# Patient Record
Sex: Male | Born: 1960 | Race: Black or African American | Hispanic: No | Smoking: Current every day smoker
Health system: Southern US, Community
[De-identification: ages and names within clinical notes are randomized; demographics above are authoritative.]

## PROBLEM LIST (undated history)

## (undated) DIAGNOSIS — E785 Hyperlipidemia, unspecified: Secondary | ICD-10-CM

## (undated) DIAGNOSIS — I509 Heart failure, unspecified: Secondary | ICD-10-CM

## (undated) DIAGNOSIS — I10 Essential (primary) hypertension: Secondary | ICD-10-CM

## (undated) DIAGNOSIS — N189 Chronic kidney disease, unspecified: Secondary | ICD-10-CM

## (undated) DIAGNOSIS — F329 Major depressive disorder, single episode, unspecified: Secondary | ICD-10-CM

## (undated) DIAGNOSIS — M1990 Unspecified osteoarthritis, unspecified site: Secondary | ICD-10-CM

## (undated) DIAGNOSIS — F32A Depression, unspecified: Secondary | ICD-10-CM

## (undated) DIAGNOSIS — N400 Enlarged prostate without lower urinary tract symptoms: Secondary | ICD-10-CM

## (undated) HISTORY — PX: TONSILLECTOMY AND ADENOIDECTOMY: SHX28

---

## 2004-05-31 ENCOUNTER — Ambulatory Visit: Payer: Self-pay

## 2004-07-13 ENCOUNTER — Emergency Department: Payer: Self-pay | Admitting: Emergency Medicine

## 2006-07-27 ENCOUNTER — Ambulatory Visit: Payer: Self-pay | Admitting: Gastroenterology

## 2007-01-10 ENCOUNTER — Ambulatory Visit: Payer: Self-pay | Admitting: Family Medicine

## 2007-08-05 ENCOUNTER — Emergency Department: Payer: Self-pay | Admitting: Emergency Medicine

## 2008-06-27 IMAGING — US ABDOMEN ULTRASOUND
1 series · 17 of 25 positions shown · non-contrast
Comparison: none

REASON FOR EXAM: Abd Pain Call Report [id number]
COMMENTS:

[Series 1: abdomen ultrasound · 17 of 66 slices shown]
[im 1/66]
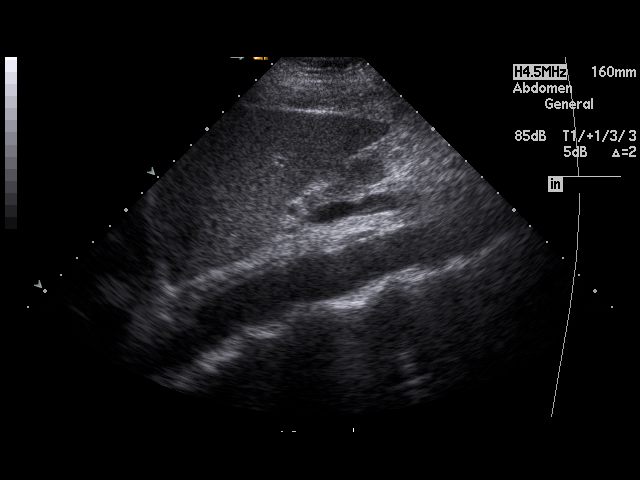
[im 6/66]
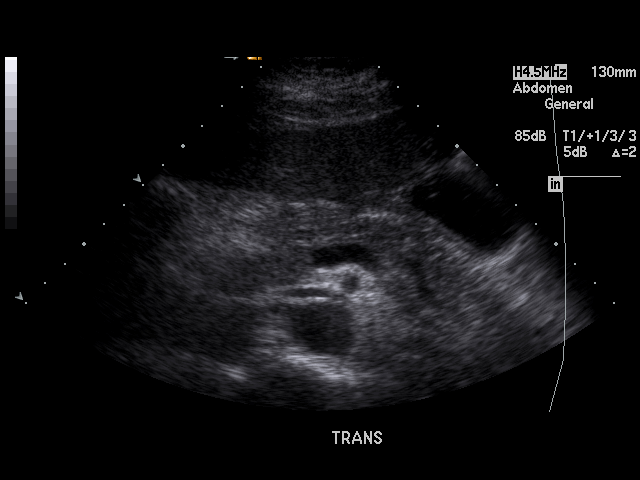
[im 9/66]
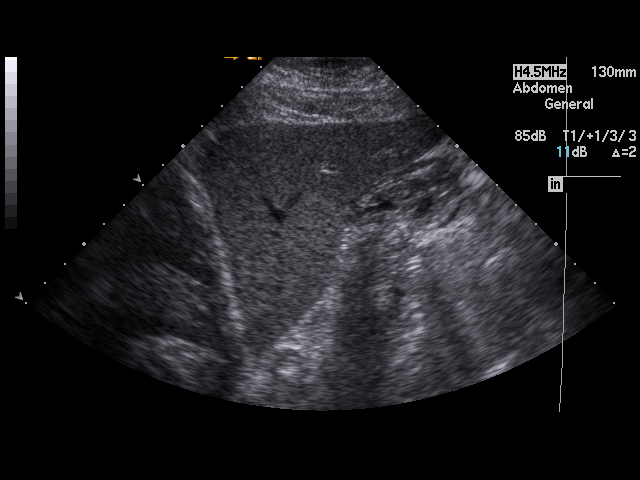
[im 14/66]
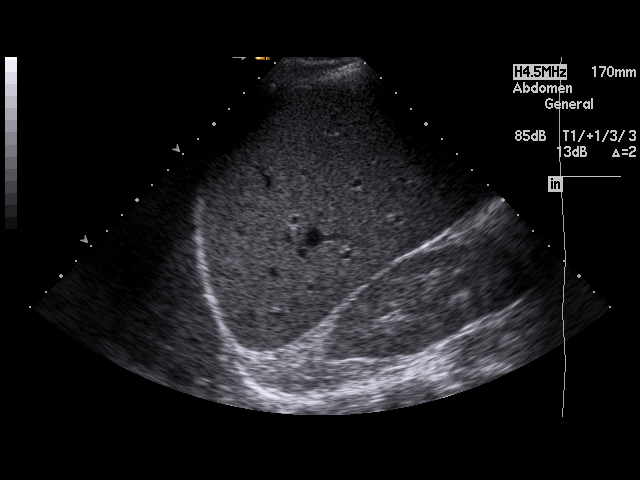
[im 17/66]
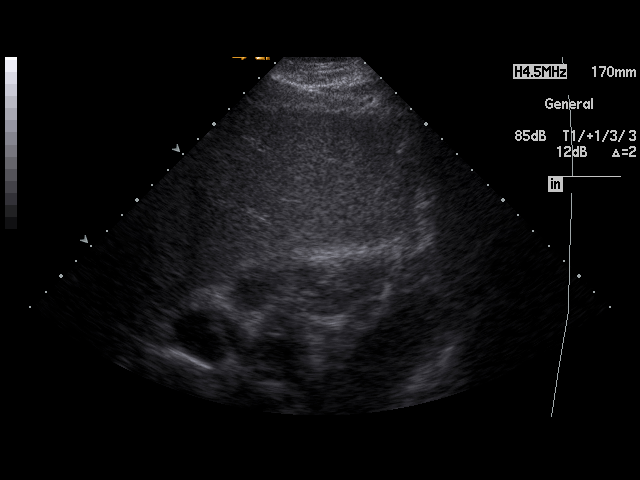
[im 22/66]
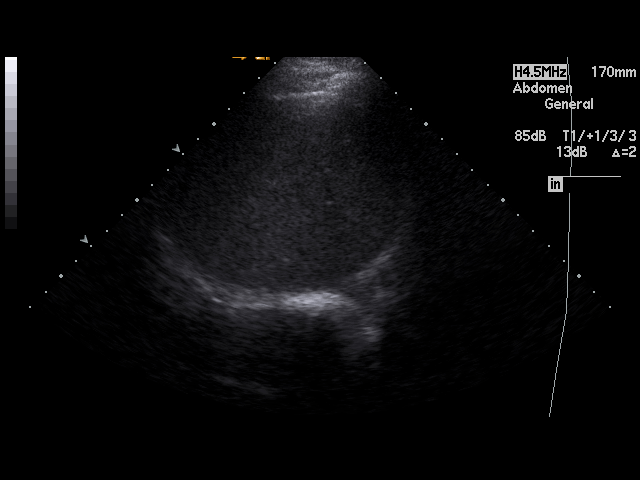
[im 25/66]
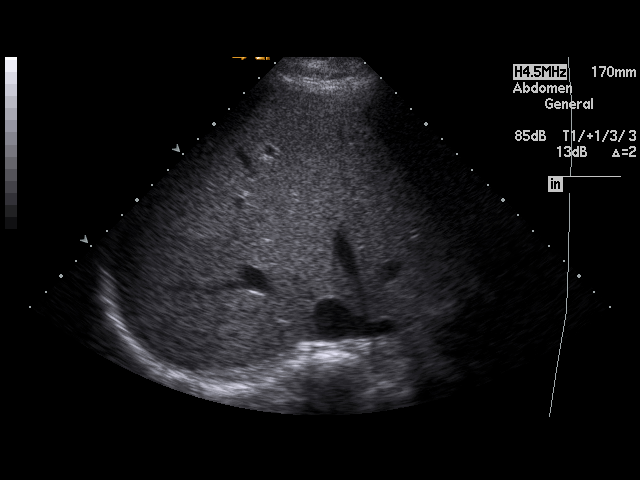
[im 30/66]
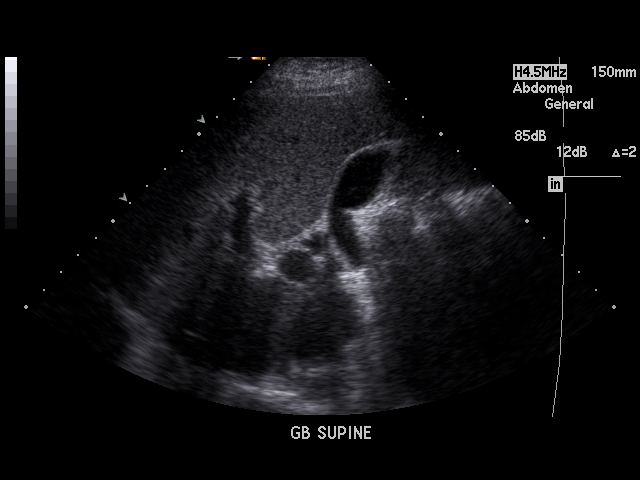
[im 33/66]
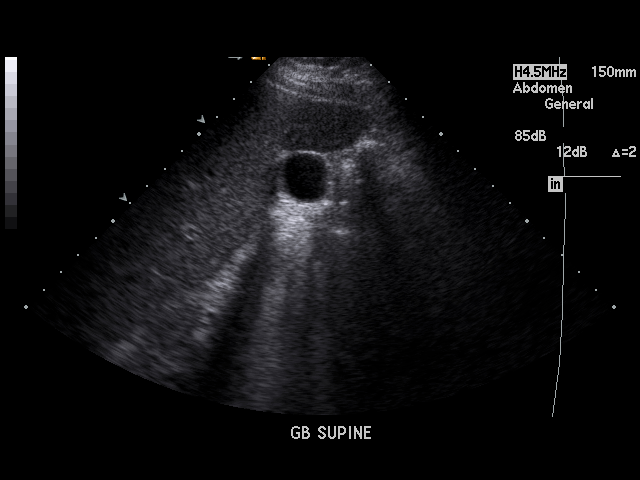
[im 36/66]
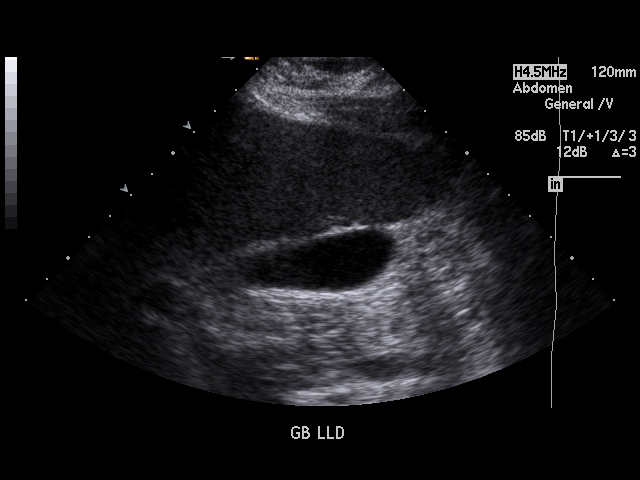
[im 41/66]
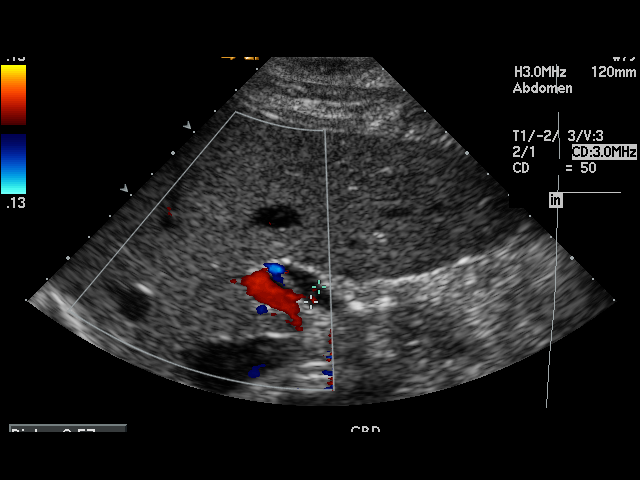
[im 44/66]
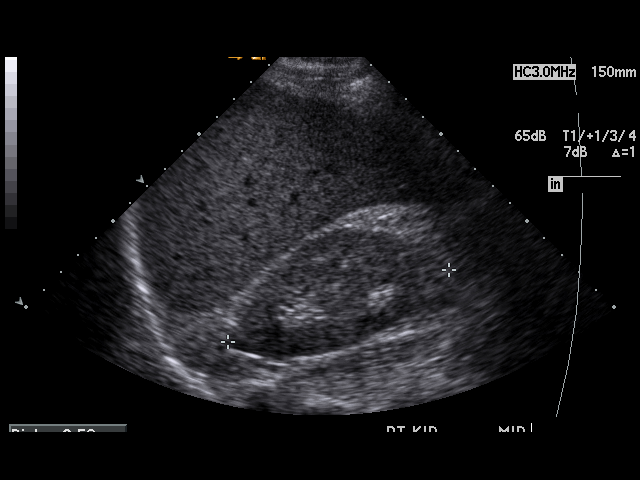
[im 49/66]
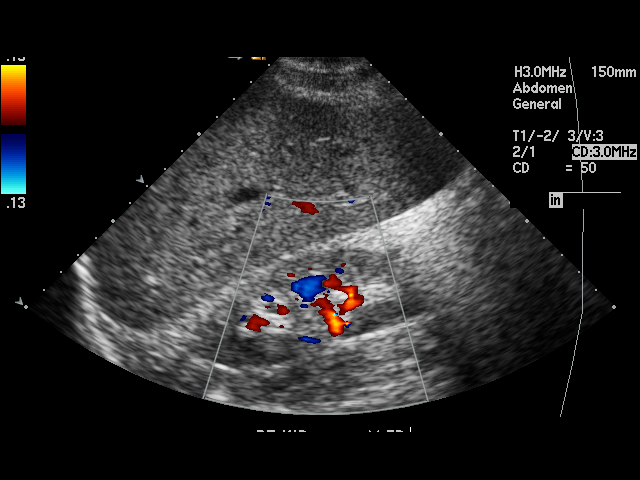
[im 52/66]
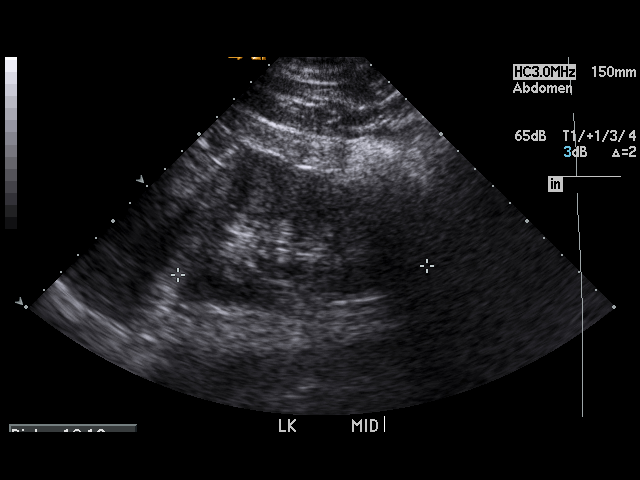
[im 57/66]
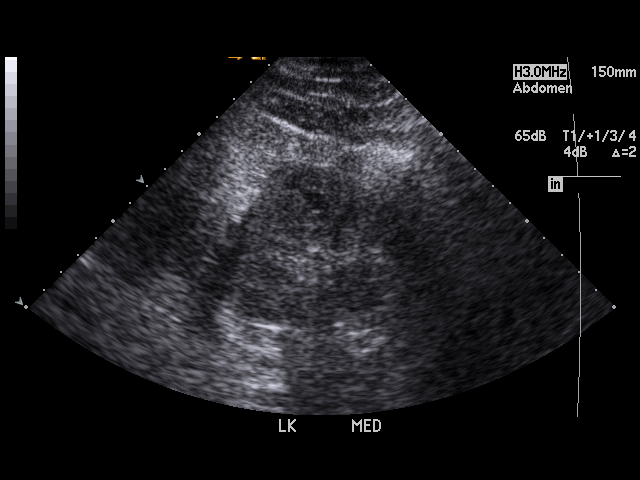
[im 60/66]
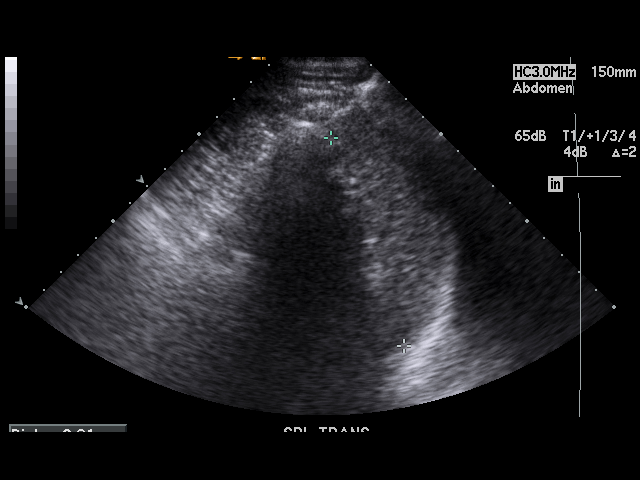
[im 66/66]
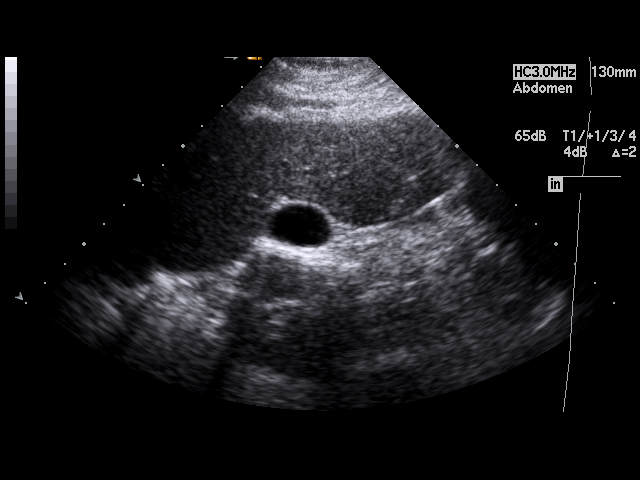

[17 of 25 positions shown; findings below may reference images not displayed]

PROCEDURE:     US  - US ABDOMEN GENERAL SURVEY  - January 10, 2007  [DATE]

RESULT:     Sonographic evaluation of the abdomen demonstrates the liver,
spleen, pancreas, gallbladder, aorta and kidneys appear normal. The common
bile duct diameter is abnormal measuring up to 6.7 mm. Correlation with LFTs
is recommended.
IMPRESSION: Unremarkable study aside from prominence of the common bile duct. Further
laboratory correlation is recommended.

## 2009-06-15 ENCOUNTER — Other Ambulatory Visit: Payer: Self-pay | Admitting: Family

## 2009-11-10 ENCOUNTER — Emergency Department: Payer: Self-pay | Admitting: Emergency Medicine

## 2011-02-28 ENCOUNTER — Other Ambulatory Visit: Payer: Self-pay | Admitting: Internal Medicine

## 2011-02-28 MED ORDER — CLONIDINE HCL 0.3 MG PO TABS: 0.3000 mg | ORAL_TABLET | Freq: Three times a day (TID) | ORAL | Status: DC

## 2011-06-02 ENCOUNTER — Other Ambulatory Visit: Payer: Self-pay | Admitting: *Deleted

## 2011-06-02 NOTE — Telephone Encounter (Signed)
Sorry this was done in error.

## 2011-06-02 NOTE — Telephone Encounter (Signed)
This is the wrong chart.  The Whitney Muse who is my patient has been seen here before and doesn't take clonidine three times daily

## 2011-06-06 ENCOUNTER — Other Ambulatory Visit: Payer: Self-pay | Admitting: Internal Medicine

## 2011-06-06 NOTE — Telephone Encounter (Signed)
This is not my patient.2nd request from pharmacy,  Please calll them and tell them they have the wrong patient/doctor.

## 2011-06-06 NOTE — Telephone Encounter (Signed)
Office Message [address] [place], [place] [address] p. [phone] f. [phone] To: [facility] (Daytime Triage) Fax: [phone] From: [facility] Date/ Time: 06/06/2011 5:05 PM Taken By: Crissie Figures, CSR Caller: Reita Cliche Facility: not collected Patient: Henry Gallagher, Henry Gallagher DOB: 1960/12/09 Phone: [phone] Reason for Call: Clonodine has not been called in for a refill pharmacy is [facility] on [address]. He has been out since last Thursday. Regarding Appointment: Appt Date: Appt Time: Unknown Provider: Reason: Details: Outcome:

## 2011-06-07 ENCOUNTER — Telehealth: Payer: Self-pay | Admitting: Internal Medicine

## 2011-06-07 MED ORDER — CLONIDINE HCL 0.3 MG PO TABS: 0.3000 mg | ORAL_TABLET | Freq: Two times a day (BID) | ORAL | Status: DC

## 2011-06-07 NOTE — Telephone Encounter (Signed)
[facility] Triage Call Report Triage Record Num: [id number] Operator: Baldomero Lamy Patient Name: Henry Gallagher Call Date & Time: 06/06/2011 5:09:15PM Patient Phone: [phone] PCP: Daphine Deutscher) Patient Gender: Male PCP Fax : [phone] Patient DOB: May 20, 1960 Practice Name: [facility] Day Reason for Call: Caller: [name]/Patient is calling with a question about Meds Not At Pharmacy.The medication was written by Duncan Dull. Pt calling regarding no RX for Catapress at pharmacy. Pt states he called in this am and no med ready-pt has none for tomorrow. Check in EPIC-note but not called in yet. Pt frustrated. OK # 3 of 0.3 mg (3 for tomorrow, 2/26) per standing order, until med called in. Called this in to [place], Pharm tech at [facility] [phone]. Protocol(s) Used: Medication Questions - Adult Recommended Outcome per Protocol: Call Dispensing Pharmacy or Provider Immediately Reason for Outcome: Unable to obtain prescribed medication related to available resources AND situation poses immediate clinical risk Care Advice: ~ Have pharmacy phone number and prescription information available when you speak with provider. ~ Ask provider if there is a generic alternative or a similar drug covered by your insurance. ~ Ask provider if they have samples. Ask provider or pharmacist about patient assistance programs run by pharmaceutical companies to provide free medications based on documented financial need. Consider the Partnership for Prescription Assistance; information available toll free [phone]. ~ 06/06/2011 5:30:21PM Page 1 of 1 [email or web address]

## 2011-06-07 NOTE — Telephone Encounter (Signed)
Notified patient of message.  He stated he is your patient but he hasn't been seen in over a year.  I asked him to make an appt but he stated he can't because he doesn't have insurance and can't afford the copay.  I told him I would let you know and he hung up the phone without saying anything else.

## 2011-06-07 NOTE — Telephone Encounter (Signed)
I have talked to patient this morning.  Dr. Darrick Huntsman will not refill this medication because she said he is not a patient of hers.  He told me he is but hasn't been seen in over a year, and will not make an appt because he stated he does not have insurance and can't afford the copay.  I have let Dr. Darrick Huntsman know of this and I am waiting for a response from her.

## 2011-06-07 NOTE — Telephone Encounter (Signed)
I am sorry that he has no insurance but I cannot fill a prescription on a patient I have no records on and haven't seen in a years. And I NEVER prescribe clonidine three times daily  But since he is without it he is probably having uncontrolled hypertension you can call him in #60,  Have him take it two times daily, no refills,  Please tell him toeither make an appt here in the next 30 days or  go to the [facility] or the [facility] clinic. If he makes an appt here he needs to get his records sent over from [facility].

## 2011-06-08 MED ORDER — CLONIDINE HCL 0.3 MG PO TABS: 0.3000 mg | ORAL_TABLET | Freq: Two times a day (BID) | ORAL | Status: DC

## 2011-06-08 NOTE — Telephone Encounter (Signed)
Notified patient of message.  I have called in 60 tablets.  He stated he will call back next week to make an appt with you because he is out of town right now.

## 2012-08-24 ENCOUNTER — Emergency Department: Payer: Self-pay | Admitting: Emergency Medicine

## 2012-09-02 ENCOUNTER — Emergency Department: Payer: Self-pay | Admitting: Emergency Medicine

## 2012-10-08 ENCOUNTER — Inpatient Hospital Stay: Payer: Self-pay | Admitting: Internal Medicine

## 2012-10-08 LAB — CK TOTAL AND CKMB (NOT AT ARMC)
CK, Total: 109 U/L (ref 35–232)
CK, Total: 73 U/L (ref 35–232)
CK-MB: 2.7 ng/mL (ref 0.5–3.6)
CK-MB: 1.9 ng/mL (ref 0.5–3.6)
CK-MB: 1.8 ng/mL (ref 0.5–3.6)

## 2012-10-08 LAB — COMPREHENSIVE METABOLIC PANEL
Albumin: 3.4 g/dL (ref 3.4–5.0)
Anion Gap: 7 (ref 7–16)
BUN: 21 mg/dL — ABNORMAL HIGH (ref 7–18)
Calcium, Total: 8.8 mg/dL (ref 8.5–10.1)
Chloride: 109 mmol/L — ABNORMAL HIGH (ref 98–107)
Creatinine: 1.92 mg/dL — ABNORMAL HIGH (ref 0.60–1.30)
Osmolality: 284 (ref 275–301)
Potassium: 4 mmol/L (ref 3.5–5.1)
SGOT(AST): 41 U/L — ABNORMAL HIGH (ref 15–37)
SGPT (ALT): 37 U/L (ref 12–78)
Sodium: 140 mmol/L (ref 136–145)
Total Protein: 7.3 g/dL (ref 6.4–8.2)

## 2012-10-08 LAB — CBC
HCT: 53.1 % — ABNORMAL HIGH (ref 40.0–52.0)
HGB: 17.6 g/dL (ref 13.0–18.0)
MCH: 32.8 pg (ref 26.0–34.0)
MCV: 99 fL (ref 80–100)

## 2012-10-08 LAB — TROPONIN I
Troponin-I: 0.43 ng/mL — ABNORMAL HIGH
Troponin-I: 0.45 ng/mL — ABNORMAL HIGH

## 2012-10-08 LAB — PRO B NATRIURETIC PEPTIDE: B-Type Natriuretic Peptide: 4993 pg/mL — ABNORMAL HIGH (ref 0–125)

## 2012-10-08 LAB — PROTIME-INR: INR: 1

## 2012-10-09 LAB — BASIC METABOLIC PANEL
BUN: 23 mg/dL — ABNORMAL HIGH (ref 7–18)
Chloride: 106 mmol/L (ref 98–107)
EGFR (Non-African Amer.): 37 — ABNORMAL LOW
Osmolality: 280 (ref 275–301)
Potassium: 3.8 mmol/L (ref 3.5–5.1)
Sodium: 139 mmol/L (ref 136–145)

## 2012-10-09 LAB — CBC WITH DIFFERENTIAL/PLATELET
Basophil #: 0 10*3/uL (ref 0.0–0.1)
Eosinophil #: 0.2 10*3/uL (ref 0.0–0.7)
Lymphocyte #: 3.1 10*3/uL (ref 1.0–3.6)
Lymphocyte %: 50.1 %
MCH: 33.2 pg (ref 26.0–34.0)
MCHC: 33.9 g/dL (ref 32.0–36.0)
Monocyte %: 7.7 %
Neutrophil %: 38 %
RBC: 5.13 10*6/uL (ref 4.40–5.90)
WBC: 6.2 10*3/uL (ref 3.8–10.6)

## 2012-10-10 LAB — BASIC METABOLIC PANEL
BUN: 27 mg/dL — ABNORMAL HIGH (ref 7–18)
Chloride: 107 mmol/L (ref 98–107)
Co2: 26 mmol/L (ref 21–32)
Creatinine: 2.14 mg/dL — ABNORMAL HIGH (ref 0.60–1.30)
Potassium: 3.6 mmol/L (ref 3.5–5.1)

## 2013-02-27 ENCOUNTER — Other Ambulatory Visit: Payer: Self-pay

## 2013-02-27 LAB — COMPREHENSIVE METABOLIC PANEL
Anion Gap: 3 — ABNORMAL LOW (ref 7–16)
BUN: 24 mg/dL — ABNORMAL HIGH (ref 7–18)
EGFR (African American): 45 — ABNORMAL LOW
EGFR (Non-African Amer.): 39 — ABNORMAL LOW
Potassium: 3.9 mmol/L (ref 3.5–5.1)
Total Protein: 7.5 g/dL (ref 6.4–8.2)

## 2014-01-14 ENCOUNTER — Ambulatory Visit: Payer: Self-pay | Admitting: Internal Medicine

## 2014-05-25 ENCOUNTER — Observation Stay: Payer: Self-pay | Admitting: Internal Medicine

## 2014-08-01 NOTE — Discharge Summary (Signed)
PATIENT NAME:  Henry Gallagher, Henry Gallagher MR#:  [id number] DATE OF BIRTH:  [birth year]  DATE OF ADMISSION:  10/08/2012 DATE OF DISCHARGE:  10/10/2012  PRIMARY CARE PHYSICIAN:  Hopefully will be at the [facility].   FINAL DIAGNOSES: 1.  Acute systolic congestive heart failure with cardiomyopathy and moderate aortic regurgitation.  2.  Malignant hypertension.  3.  Chronic kidney disease, stage 3.  4.  Elevated troponin.   MEDICATIONS ON DISCHARGE:  Include lisinopril 10 mg twice a day, aspirin 81 mg daily, Coreg 3.125 mg twice a day, Lasix 20 mg 3 tabs once a day, hydralazine 25 mg every 8 hours, potassium chloride 10 mEq extended-release daily.   DIET:  Low sodium diet, regular consistency.   ACTIVITY:  As tolerated.   FOLLOW-UP:  With Dr. Ubaldo Glassing, cardiology, in one week.  Dr. Juleen China, nephrology, in four weeks.  [facility] in 2 to 4 weeks.    The patient was admitted 09/28/2012 and discharged 10/10/2012, came in with shortness of breath and was admitted with congestive heart failure, malignant hypertension and chronic kidney disease.  Laboratory and radiological data during the hospital course included an EKG that showed sinus rhythm, premature ventricular complexes.  Troponin borderline at 0.49, INR of 1.0.  White blood cell count 5.6, H and H 17.6 and 53.1, platelet count of 184, glucose 128, BUN 21, creatinine 1.92, sodium 140, potassium 4.0, chloride 109, CO2 24, calcium 8.8.  Liver function tests normal range.  Chest x-ray showed findings secondary to interstitial pulmonary edema, small bilateral pulmonary effusions.  Troponin borderline at 0.43.  Troponin borderline at 0.45.  Echocardiogram showed an EF of 25% to 30%, moderately to severely global left ventricular systolic function, moderate to severely increased left ventricular internal cavity size, mild mitral valve regurgitation, moderate aortic regurgitation.  Creatinine upon discharge 2.14 with a GFR of 40.   HOSPITAL COURSE PER  PROBLEM LIST:  1.  For the patient's acute systolic congestive heart failure with cardiomyopathy and moderate aortic regurgitation, the patient was diuresed with IV Lasix, started on Coreg and then switched to metoprolol and back to Coreg since he had sweating with the metoprolol.  He was also started on lisinopril and hydralazine.  He was also given a nitro patch.  Blood pressure upon discharge came down to 140/88.  The patient's lungs were clear upon discharge.  He was breathing comfortably on room air, stable for discharge home.  2.  For his malignant hypertension, he ran out of his clonidine for a few days prior to coming into the hospital.  Blood pressure on presentation was 227/133.  Blood pressure upon discharge 140/88.  The patient was discharged home on Coreg, lisinopril, Lasix and hydralazine.  Continued monitoring as outpatient as needed.  3.  Chronic kidney disease, stage 3.  Creatinine did not move with the IV Lasix or starting of the lisinopril.  I do recommend checking a BMP in follow-up appointment.  I believe this is all secondary to chronic kidney disease and uncontrolled blood pressure longstanding and also poor perfusion with cardiomyopathy.  I did give the patient the card for the nephrologist to follow up as outpatient.  Hopefully can avoid dialysis in the future.  4.  Elevated troponin, most likely secondary to systolic congestive heart failure, malignant hypertension and chronic kidney disease.  This is not a myocardial infarction.  The patient is on Coreg and aspirin.   Time spent on discharge 35 minutes.    ____________________________ Tana Conch. Leslye Peer, MD  rjw:ea D: 10/10/2012 16:28:15 ET T: 10/10/2012 23:22:17 ET JOB#: [id number]  cc: Tana Conch. Leslye Peer, MD, <Dictator> [place] A. Ubaldo Glassing, MD Mamie Levers, MD   Marisue Brooklyn MD ELECTRONICALLY SIGNED 10/20/2012 11:07

## 2014-08-01 NOTE — Consult Note (Signed)
   Present Illness 54 yo male with history of hyperension who was admitted after being off of all of his medications including clonidine for 3 weeks due to difficulty in buying his medications. He was noted to be profoundly hyertensive complicated by pulmonary edema. He has a mild serum troponin elevation to 0.43. He denies chet pain and feels somewhat better since resuming his meds.   Physical Exam:  GEN no acute distress, thin   HEENT PERRL, hearing intact to voice   NECK supple  No masses   RESP normal resp effort  no use of accessory muscles   CARD Regular rate and rhythm  Normal, S1, S2  No murmur   ABD denies tenderness  normal BS  no Adominal Mass   LYMPH negative neck, negative axillae   EXTR negative edema   SKIN normal to palpation   NEURO cranial nerves intact, motor/sensory function intact   PSYCH A+O to time, place, person   Review of Systems:  Subjective/Chief Complaint shortness of breath   General: Fatigue   Skin: No Complaints   ENT: No Complaints   Eyes: No Complaints   Neck: No Complaints   Respiratory: Short of breath   Cardiovascular: Dyspnea   Gastrointestinal: No Complaints   Genitourinary: No Complaints   Vascular: No Complaints   Musculoskeletal: No Complaints   Neurologic: No Complaints   Hematologic: No Complaints   Endocrine: No Complaints   Psychiatric: No Complaints   Review of Systems: All other systems were reviewed and found to be negative   Medications/Allergies Reviewed Medications/Allergies reviewed   EKG:  EKG NSR   Abnormal NSSTTW changes   Interpretation sr with multiple pvcs    No Known Allergies:    Impression Pt with history of hypertension which is accelerated due to noncompliance with meds. Has been off of his meds for the past 3 weeks. He has evidence of pulmonary edema on cxr. Mil troponin elevation which does not appear to be seondary to an acute coronary event. APpears to be demand ischemia. CHF  appears to be acute on chronic diastollic chf. Accelerated hypertension appears to be etiology. Compiane with meds was discussed.   Plan 1. Resume home meds 2. Low sodium diet. 3. Social services eval regarding financial difficulties with eds 4. Ambulate and follow for symptoms.   Electronic Signatures: Teodoro Spray (MD)  (Signed 30-Jun-14 21:29)  Authored: General Aspect/Present Illness, History and Physical Exam, Review of System, EKG , Allergies, Impression/Plan   Last Updated: 30-Jun-14 21:29 by Teodoro Spray (MD)

## 2014-08-01 NOTE — H&P (Signed)
PATIENT NAME:  Henry Gallagher, Henry Gallagher MR#:  [id number] DATE OF BIRTH:  16-Apr-1960  DATE OF ADMISSION:  10/08/2012  PRIMARY CARE PHYSICIAN: None.  REFERRING PHYSICIAN:  Lavonia Drafts, MD  CHIEF COMPLAINT: Shortness of breath.   HISTORY OF PRESENT ILLNESS: Henry Gallagher is a 54 year old African American male with known history of hypertension who has been experiencing shortness of breath for the last 2 weeks. Having shortness of breath with mild exertion and even sometimes at rest. Has orthopnea. Last night woke up in the middle of sleep with severe shortness of breath. Concerning this came to the Emergency Department. The patient has been off of his blood pressure medications for the last 1 week. The patient is supposed to take 0.1 mg of clonidine 3 times a day. The patient was in severe respiratory distress. Was also found to have blood pressure of 191/125. Chest x-ray reveals bilateral pulmonary edema with small bilateral pleural effusions. The patient was given 1 dose of Lasix in the Emergency Department. The patient is with improvement of the blood pressure to 168/94. The patient states has not been taking his medications secondary to financial reasons. The patient states has been unemployed for the last 4 years. The patient continues to smoke 1/2 pack a day. The patient is also found to have mild elevation of the troponins of 0.49; however, the patient denies having any chest pain. Denies having any lower extremity swelling.   PAST MEDICAL HISTORY:  Hypertension.   PAST SURGICAL HISTORY: None.   ALLERGIES: No known drug allergies.   HOME MEDICATIONS:  Clonidine 0.5 mg 3 times a day.   SOCIAL HISTORY: Continues to smoke 1/2 pack a day and drinks alcohol occasionally. Used in the past, marijuana. Currently lives with his sister.   FAMILY HISTORY: Sister with diabetes mellitus. Mother with heart failure.   REVIEW OF SYSTEMS: CONSTITUTIONAL: Generalized weakness.  EYES: No change in vision.  ENT: No  change in hearing.  RESPIRATORY: Has cough, shortness of breath. CARDIOVASCULAR: Shortness of breath with exertion.  GASTROINTESTINAL: No nausea, vomiting, diarrhea. No abdominal pain. GENITOURINARY:  No dysuria or hematuria.  ENDOCRINE: No polyuria or polydipsia. HEME AND LYMPH:  No easy bruising or bleeding.  SKIN: No rash or lesions.  MUSCULOSKELETAL: No joint pains and aches. NEUROLOGIC:  No numbness or weakness in any part of the body.  PSYCH:  No history of any depression.   PHYSICAL EXAMINATION: GENERAL: This is a well-built, well-nourished age-appropriate male lying down in the bed, not in distress.  VITAL SIGNS: Temperature 98.1, pulse 87, blood pressure 191/125, respiratory rate 18 and oxygen saturation is 93% on room air.  HEENT: Head normocephalic, atraumatic. There is no sclerae icterus. Conjunctivae normal. Pupils equal and react to light. Extraocular movements are intact. Mucous membranes moist. No pharyngeal erythema.  NECK: Supple. No lymphadenopathy. No JVD. No carotid bruit.  CHEST: Has no focal tenderness. Bilateral diffuse crackles up to bilateral mid lungs.  HEART:  S1 and S2 regular. Tachycardia.  ABDOMEN: Bowel sounds present. Soft, nontender and nondistended.  EXTREMITIES: No pedal edema. Pulses 2+.  SKIN: No rash or lesions.  MUSCULOSKELETAL: Good range of motion in all the extremities.  NEUROLOGIC: The patient is alert and oriented to place, person and time. Cranial nerves II through XII intact. No motor and sensory deficits.   LABORATORY AND DIAGNOSTICS: CBC: WBC 5.6, hemoglobin 7.6 and platelet count 184. Coag profile is within normal limits. Troponin 0.49. CMP: BUN 21 and creatinine of 1.92. BNP is 5000.  EKG, 12-lead: Normal sinus rhythm with multiple PVCs. Poor R wave progression in the anteroseptal leads. T wave inversions in the lateral leads.  ASSESSMENT AND PLAN: Henry Gallagher is a 54 year old male who comes to the Emergency Department with hypertensive  urgency and congestive heart failure.  1.  Congestive heart failure secondary to most likely from uncontrolled hypertension, however, the patient has other risk factors of tobacco use, family history and alcohol use. Will obtain the echocardiogram. Keep strict ins and outs.  Lasix 40 mg b.i.d.  2.  Hypertension, accelerated. The patient states this could be rebound from the clonidine withdrawal; however, we will keep the patient on lisinopril and hydrochlorothiazide as well as Coreg. The patient states unable to buy the medications secondary to financial reasons. Plan to keep the patient on labetalol as needed for high blood pressures. 3.  Renal insufficiency.  The etiology most likely from the poorly-controlled hypertension as well as congestive heart failure. Continue with Lasix, control the blood pressure and follow up.  4.  Tobacco use. Counseled the patient.  5.  Elevated troponins. We will continue to monitor the cardiac enzymes x 3. We will obtain echocardiogram. Obtain a cardiology consult.  6.  Keep the patient on deep vein thrombosis prophylaxis with Lovenox.   TIME SPENT: 50 minutes.  ____________________________ Monica Becton, MD pv:sb D: 10/08/2012 08:44:01 ET T: 10/08/2012 09:16:50 ET JOB#: [id number]  cc: Monica Becton, MD, <Dictator> Monica Becton MD ELECTRONICALLY SIGNED 10/17/2012 21:58

## 2014-08-10 NOTE — Discharge Summary (Signed)
PATIENT NAME:  Henry Gallagher, Henry Gallagher MR#:  [id number] DATE OF BIRTH:  23-Oct-1960  DATE OF ADMISSION:  05/25/2014 DATE OF DISCHARGE:  05/25/2014  PRESENTING COMPLAINT: Chest pain.   DISCHARGE DIAGNOSES:  1.  Chest pain, resolved.  2.  Chronic systolic congestive heart failure.  3.  Saturations more than 95% on room air.   MEDICATIONS:  1.  Aspirin 81 mg daily.  2.  Carvedilol 3.125 p.o. every 8 hourly.  3.  Furosemide 20 mg 3 tablets once a day.  4.  Hydralazine 25 mg 1 every 8 hourly.  5.  Lisinopril 10 mg b.i.d.  6.  K-Dur 10 mEq p.o. daily.  7.  Nitroglycerin 0.4 mg sublingual every 5 minutes as needed.   DIET: Low sodium.   FOLLOWUP: At [facility] in 1-2 weeks.   LABORATORY DATA: Cardiac enzymes 0.09, 0.08, 0.10. CBC within normal limits. B-type natriuretic peptide 496.   BRIEF SUMMARY OF HOSPITAL COURSE: [name] is a 54 year old African American gentleman with history of known cardiomyopathy, EF around 25%, came to the Emergency Room which with chest pain that woke. He was given some aspirin in the Emergency Room and his chest pain resolved thereafter. His troponin was 0.10, 0.08, 0.09. The patient did not have any acute EKG changes. He felt better, denied any shortness of breath. Vitals remained stable. His home medications were continued. He will follow up with his cardiologist as outpatient. The patient was clinically stable at discharge.   TIME SPENT: 40 minutes.    ____________________________ Hart Rochester Posey Pronto, MD sap:bm D: 05/30/2014 00:55:56 ET T: 05/30/2014 02:57:16 ET JOB#: [id number]  cc: Jaeliana Lococo A. Posey Pronto, MD, <Dictator> [facility] Ilda Basset MD ELECTRONICALLY SIGNED 06/10/2014 17:31

## 2014-08-10 NOTE — H&P (Signed)
PATIENT NAME:  Henry Gallagher, Henry Gallagher MR#:  [id number] DATE OF BIRTH:  [birth year]  DATE OF ADMISSION:  05/25/2014  PRIMARY CARE PHYSICIAN: [facility].   CHIEF COMPLAINT: Chest pain today.   HISTORY OF PRESENT ILLNESS: Henry Gallagher is a 54 year old African American gentleman with past medical history of cardiomyopathy, ejection fraction around 25%, history of hypertension and tobacco abuse comes to the Emergency Room after he had some chest pain when he woke up this morning. The patient was given some aspirin in the Emergency Room. His chest pain has resolved. He denies any shortness of breath at this time. He is chest pain-free.   PAST MEDICAL HISTORY:  1. Cardiomyopathy, ejection fraction of 25% by echocardiogram in October of 2015 along with moderate aortic regurgitation.  2. Hypertension.  3. CKD stage III.  4. History of elevated troponins in the past.   SOCIAL HISTORY: Smokes about less than pack a day. Denies any alcohol abuse.   FAMILY HISTORY: Positive for diabetes mother, a sister with diabetes, mother with heart failure.   REVIEW OF SYSTEMS:  CONSTITUTIONAL: No fever, fatigue, weakness.  EYES: No blurred or double vision, cataracts.  ENT: No tinnitus, ear pain, hearing loss.  RESPIRATORY: No cough, wheeze, hemoptysis.  CARDIOVASCULAR: Positive for some chest pain earlier resolved. Hypertension. No dyspnea on exertion or edema.   GASTROINTESTINAL: No nausea, vomiting, diarrhea, abdominal pain.  GENITOURINARY: No dysuria, hematuria, or frequency.   ENDOCRINE: No polyuria, nocturia, or thyroid problems.  HEMATOLOGY: No anemia or easy bruising.  SKIN: No acne or rash.  MUSCULOSKELETAL: No arthritis or swelling or gout.  NEUROLOGIC: No CVA, TIA, dysarthria or dementia.  PSYCHIATRIC: No anxiety or depression.   All other systems reviewed and negative.   EKG shows occasional PVCs with some t wave changes in lateral leads, which were present in previous EKG. No acute ST elevation or  depression.   HOME MEDICATIONS: 1. Potassium 10 mEq p.o. daily.  2. Lisinopril 10 mg b.i.d.  3. Hydralazine 25 mg t.i.d.  4. Lasix 20 mg 3 tablets once a day.  5. Coreg 3.125 mg t.i.d.  6. Aspirin 81 mg daily.   ALLERGIES: NO KNOWN DRUG ALLERGIES.    LABORATORY DATA: Troponin is 0.10. Repeat troponin is 0.018.   Chest x-ray no active disease.   CBC within normal limits.   Creatinine is 1.73. Potassium is 3.8.   ASSESSMENT: A 54 year old, Henry Gallagher, with history of cardiomyopathy, ejection fraction of 25% hypertension, comes in with:   1. Chest pain. Appears chest pain is resolved. Appears atypical. The patient is chest pain-free without any intervention other than just giving him an aspirin. His troponin is 0.10, come down to 0.08. He has had previous elevated troponins in the past, as well. We will draw one more level of troponin. Continue aspirin, beta blockers, lisinopril and also continue p.r.n. nitroglycerin. Consider cardiology consultation if symptoms recur. The patient is agreeable with that.  2. Malignant hypertension on hydralazine, Coreg and lisinopril, which has been continued.  3. Chronic kidney disease stage III.  Creatinine is 1.7. Will resume all his home medications. A followup with Dr. Juleen Gallagher as outpatient.  4. Cardiomyopathy, chronic systolic with ejection fraction of 25%, an echocardiogram done in October 2015. I will not repeat it at this time. The patient does not appear volume overloaded. He is on Lasix, which will be resumed.  5. Deep vein thrombosis prophylaxis, subcutaneous heparin t.i.d.  6. Above was discussed with patient. Further workup regarding the patient's  clinical course.   TIME SPENT: 50 minutes.   ____________________________ Henry Rochester Posey Pronto, MD sap:ap D: 05/25/2014 13:01:38 ET T: 05/25/2014 13:45:10 ET JOB#: [id number]  cc: Henry Swalley A. Posey Pronto, MD, <Dictator> [facility] Henry Basset MD ELECTRONICALLY SIGNED 06/10/2014 17:30

## 2014-09-17 ENCOUNTER — Ambulatory Visit: Payer: Self-pay | Admitting: Internal Medicine

## 2015-05-15 ENCOUNTER — Encounter: Payer: Self-pay | Admitting: *Deleted

## 2015-05-18 ENCOUNTER — Encounter: Payer: Self-pay | Admitting: *Deleted

## 2015-05-18 ENCOUNTER — Ambulatory Visit
Admission: RE | Admit: 2015-05-18 | Discharge: 2015-05-18 | Disposition: A | Discharge status: Home / Self Care | Payer: Medicaid Other | Source: Ambulatory Visit | Attending: Gastroenterology | Admitting: Gastroenterology

## 2015-05-18 ENCOUNTER — Ambulatory Visit: Payer: Medicaid Other | Admitting: Anesthesiology

## 2015-05-18 ENCOUNTER — Encounter
Admission: RE | Disposition: A | Discharge status: Home / Self Care | Payer: Self-pay | Source: Ambulatory Visit | Attending: Gastroenterology

## 2015-05-18 DIAGNOSIS — M1990 Unspecified osteoarthritis, unspecified site: Secondary | ICD-10-CM | POA: Insufficient documentation

## 2015-05-18 DIAGNOSIS — I129 Hypertensive chronic kidney disease with stage 1 through stage 4 chronic kidney disease, or unspecified chronic kidney disease: Secondary | ICD-10-CM | POA: Insufficient documentation

## 2015-05-18 DIAGNOSIS — D122 Benign neoplasm of ascending colon: Secondary | ICD-10-CM | POA: Insufficient documentation

## 2015-05-18 DIAGNOSIS — D124 Benign neoplasm of descending colon: Secondary | ICD-10-CM | POA: Insufficient documentation

## 2015-05-18 DIAGNOSIS — Z7982 Long term (current) use of aspirin: Secondary | ICD-10-CM | POA: Insufficient documentation

## 2015-05-18 DIAGNOSIS — K648 Other hemorrhoids: Secondary | ICD-10-CM | POA: Insufficient documentation

## 2015-05-18 DIAGNOSIS — N400 Enlarged prostate without lower urinary tract symptoms: Secondary | ICD-10-CM | POA: Diagnosis not present

## 2015-05-18 DIAGNOSIS — F329 Major depressive disorder, single episode, unspecified: Secondary | ICD-10-CM | POA: Insufficient documentation

## 2015-05-18 DIAGNOSIS — Z79899 Other long term (current) drug therapy: Secondary | ICD-10-CM | POA: Insufficient documentation

## 2015-05-18 DIAGNOSIS — K625 Hemorrhage of anus and rectum: Secondary | ICD-10-CM | POA: Diagnosis present

## 2015-05-18 DIAGNOSIS — E785 Hyperlipidemia, unspecified: Secondary | ICD-10-CM | POA: Insufficient documentation

## 2015-05-18 DIAGNOSIS — F17200 Nicotine dependence, unspecified, uncomplicated: Secondary | ICD-10-CM | POA: Diagnosis not present

## 2015-05-18 HISTORY — DX: Heart failure, unspecified: I50.9

## 2015-05-18 HISTORY — DX: Depression, unspecified: F32.A

## 2015-05-18 HISTORY — DX: Benign prostatic hyperplasia without lower urinary tract symptoms: N40.0

## 2015-05-18 HISTORY — DX: Essential (primary) hypertension: I10

## 2015-05-18 HISTORY — DX: Chronic kidney disease, unspecified: N18.9

## 2015-05-18 HISTORY — DX: Hyperlipidemia, unspecified: E78.5

## 2015-05-18 HISTORY — PX: COLONOSCOPY WITH PROPOFOL: SHX5780

## 2015-05-18 HISTORY — DX: Unspecified osteoarthritis, unspecified site: M19.90

## 2015-05-18 HISTORY — DX: Major depressive disorder, single episode, unspecified: F32.9

## 2015-05-18 SURGERY — COLONOSCOPY WITH PROPOFOL
Anesthesia: General

## 2015-05-18 MED ORDER — LIDOCAINE HCL (CARDIAC) 20 MG/ML IV SOLN
INTRAVENOUS | Status: DC | PRN
Start: 2015-05-18 — End: 2015-05-18
  Administered 2015-05-18: 30 mg via INTRAVENOUS

## 2015-05-18 MED ORDER — PROPOFOL 10 MG/ML IV BOLUS
INTRAVENOUS | Status: DC | PRN
  Administered 2015-05-18: 40 mg via INTRAVENOUS
  Administered 2015-05-18: 50 mg via INTRAVENOUS
  Administered 2015-05-18: 100 mg via INTRAVENOUS

## 2015-05-18 MED ORDER — PROPOFOL 500 MG/50ML IV EMUL
INTRAVENOUS | Status: DC | PRN
  Administered 2015-05-18: 125 ug/kg/min via INTRAVENOUS

## 2015-05-18 MED ORDER — SODIUM CHLORIDE 0.9 % IV SOLN
INTRAVENOUS | Status: DC
  Administered 2015-05-18: 10:00:00 via INTRAVENOUS

## 2015-05-18 NOTE — Anesthesia Preprocedure Evaluation (Signed)
Anesthesia Evaluation  Patient identified by MRN, date of birth, ID band Patient awake    Reviewed: Allergy & Precautions, H&P , NPO status , Patient's Chart, lab work & pertinent test results, reviewed documented beta blocker date and time   History of Anesthesia Complications Negative for: history of anesthetic complications  Airway Mallampati: I  TM Distance: >3 FB Neck ROM: full    Dental no notable dental hx. (+) Missing, Teeth Intact   Pulmonary neg shortness of breath, neg sleep apnea, neg COPD, neg recent URI, Current Smoker,    Pulmonary exam normal breath sounds clear to auscultation       Cardiovascular Exercise Tolerance: Good hypertension, +CHF  (-) CAD, (-) Past MI, (-) Cardiac Stents and (-) CABG Normal cardiovascular exam+ dysrhythmias (-) Valvular Problems/Murmurs Rhythm:regular Rate:Normal     Neuro/Psych PSYCHIATRIC DISORDERS (Depression) negative neurological ROS     GI/Hepatic negative GI ROS, Neg liver ROS,   Endo/Other  negative endocrine ROS  Renal/GU CRFRenal disease  negative genitourinary   Musculoskeletal   Abdominal   Peds  Hematology negative hematology ROS (+)   Anesthesia Other Findings Past Medical History:   Hypertension                                                 Arthritis                                                    CHF (congestive heart failure) (HCC)                         Hyperlipidemia                                               Benign prostatic hypertrophy                                 Depression                                                   Chronic kidney disease                                       Reproductive/Obstetrics negative OB ROS                             Anesthesia Physical Anesthesia Plan  ASA: III  Anesthesia Plan: General   Post-op Pain Management:    Induction:   Airway Management Planned:    Additional Equipment:   Intra-op Plan:   Post-operative Plan:   Informed Consent: I have reviewed the patients History and Physical, chart, labs and discussed the procedure including the risks, benefits and alternatives for the proposed  anesthesia with the patient or authorized representative who has indicated his/her understanding and acceptance.   Dental Advisory Given  Plan Discussed with: Anesthesiologist, CRNA and Surgeon  Anesthesia Plan Comments:         Anesthesia Quick Evaluation

## 2015-05-18 NOTE — Transfer of Care (Signed)
Immediate Anesthesia Transfer of Care Note  Patient: Henry Gallagher  Procedure(s) Performed: Procedure(s): COLONOSCOPY WITH PROPOFOL (N/A)  Patient Location: Endoscopy Unit  Anesthesia Type:General  Level of Consciousness: sedated  Airway & Oxygen Therapy: Patient Spontanous Breathing and Patient connected to nasal cannula oxygen  Post-op Assessment: Report given to RN and Post -op Vital signs reviewed and stable  Post vital signs: Reviewed and stable  Last Vitals:  Filed Vitals:   05/18/15 0937  BP: 154/84  Pulse: 50  Temp: 36.7 C  Resp: 18    Complications: No apparent anesthesia complications

## 2015-05-18 NOTE — H&P (Signed)
    Primary Care Physician:  Volanda Napoleon, MD Primary Gastroenterologist:  Dr. Candace Cruise  Pre-Procedure History & Physical: HPI:  Henry Gallagher is a 55 y.o. male is here for an colonoscopy.  Past Medical History  Diagnosis Date  . Hypertension   . Arthritis   . CHF (congestive heart failure) ([facility])   . Hyperlipidemia   . Benign prostatic hypertrophy   . Depression   . Chronic kidney disease     Past Surgical History  Procedure Laterality Date  . Tonsillectomy and adenoidectomy      Prior to Admission medications   Medication Sig Start Date End Date Taking? Authorizing Provider  aspirin EC 81 MG tablet Take 81 mg by mouth daily.   Yes Historical Provider, MD  furosemide (LASIX) 20 MG tablet Take 60 mg by mouth daily.   Yes Historical Provider, MD  hydrALAZINE (APRESOLINE) 50 MG tablet Take 50 mg by mouth 3 (three) times daily.   Yes Historical Provider, MD  lisinopril (PRINIVIL,ZESTRIL) 10 MG tablet Take 10 mg by mouth 2 (two) times daily.   Yes Historical Provider, MD  potassium chloride (K-DUR) 10 MEQ tablet Take 10 mEq by mouth daily.   Yes Historical Provider, MD  cloNIDine (CATAPRES) 0.3 MG tablet Take 1 tablet (0.3 mg total) by mouth 2 (two) times daily. 06/08/11 06/07/12  Crecencio Mc, MD    Allergies as of 05/07/2015  . (Not on File)    History reviewed. No pertinent family history.  Social History   Social History  . Marital Status: Divorced    Spouse Name: N/A  . Number of Children: N/A  . Years of Education: N/A   Occupational History  . Not on file.   Social History Main Topics  . Smoking status: Current Every Day Smoker  . Smokeless tobacco: Not on file  . Alcohol Use: Not on file  . Drug Use: Not on file  . Sexual Activity: Not on file   Other Topics Concern  . Not on file   Social History Narrative    Review of Systems: See HPI, otherwise negative ROS  Physical Exam: BP 154/84 mmHg  Pulse 50  Temp(Src) 98 F (36.7 C) (Tympanic)   Resp 18  Ht 6\' 1"  (1.854 m)  Wt 84.823 kg (187 lb)  BMI 24.68 kg/m2  SpO2 100% General:   Alert,  pleasant and cooperative in NAD Head:  Normocephalic and atraumatic. Neck:  Supple; no masses or thyromegaly. Lungs:  Clear throughout to auscultation.    Heart:  Regular rate and rhythm. Abdomen:  Soft, nontender and nondistended. Normal bowel sounds, without guarding, and without rebound.   Neurologic:  Alert and  oriented x4;  grossly normal neurologically.  Impression/Plan: KARANDEEP HASKILL is here for an colonoscopy to be performed for rectal bleeding. Risks, benefits, limitations, and alternatives regarding  colonoscopy have been reviewed with the patient.  Questions have been answered.  All parties agreeable.   Henry Gallagher, Henry Dawn, MD  05/18/2015, 10:09 AM

## 2015-05-18 NOTE — Op Note (Signed)
[facility] Gastroenterology Patient Name: Henry Gallagher Procedure Date: 05/18/2015 10:24 AM MRN: [id number] Account #: [id number] Date of Birth: Apr 22, 1960 Admit Type: Outpatient Age: 54 Room: [facility] ENDO ROOM 4 Gender: Male Note Status: Finalized Procedure:         Colonoscopy Indications:       Rectal bleeding Providers:         Lupita Dawn. Candace Cruise, MD Referring MD:      Venetia Maxon. Elijio Miles, MD (Referring MD) Medicines:         Monitored Anesthesia Care Complications:     No immediate complications. Procedure:         Pre-Anesthesia Assessment:                    - Prior to the procedure, a History and Physical was                     performed, and patient medications, allergies and                     sensitivities were reviewed. The patient's tolerance of                     previous anesthesia was reviewed.                    - The risks and benefits of the procedure and the sedation                     options and risks were discussed with the patient. All                     questions were answered and informed consent was obtained.                    - After reviewing the risks and benefits, the patient was                     deemed in satisfactory condition to undergo the procedure.                    After obtaining informed consent, the colonoscope was                     passed under direct vision. Throughout the procedure, the                     patient's blood pressure, pulse, and oxygen saturations                     were monitored continuously. The Colonoscope was                     introduced through the anus and advanced to the the cecum,                     identified by appendiceal orifice and ileocecal valve. The                     colonoscopy was performed without difficulty. The patient                     tolerated the procedure well. The quality of the bowel  preparation was good. Findings:      The perianal exam findings  include non-thrombosed internal hemorrhoids.      A small polyp was found in the descending colon. The polyp was sessile.       The polyp was removed with a jumbo cold forceps. Resection and retrieval       were complete.      A large polyp was found in the proximal ascending colon. The polyp was       sessile. The polyp was removed with a hot snare. Resection and retrieval       were complete.      The exam was otherwise without abnormality. Impression:        - Non-thrombosed internal hemorrhoids found on perianal                     exam.                    - One small polyp in the descending colon. Resected and                     retrieved.                    - One large polyp in the proximal ascending colon.                     Resected and retrieved.                    - The examination was otherwise normal. Recommendation:    - Discharge patient to home.                    - Await pathology results.                    - Repeat colonoscopy in 3 years for surveillance based on                     pathology results.                    - The findings and recommendations were discussed with the                     patient. Procedure Code(s): --- Professional ---                    [phone], Colonoscopy, flexible; with removal of tumor(s),                     polyp(s), or other lesion(s) by snare technique                    [id number], 43, Colonoscopy, flexible; with biopsy, single or                     multiple Diagnosis Code(s): --- Professional ---                    D12.4, Benign neoplasm of descending colon                    D12.2, Benign neoplasm of ascending colon                    K64.8, Other hemorrhoids  K62.5, Hemorrhage of anus and rectum CPT copyright 2014 American Medical Association. All rights reserved. The codes documented in this report are preliminary and upon coder review may  be revised to meet current compliance requirements. Hulen Luster,  MD 05/18/2015 10:49:25 AM This report has been signed electronically. Number of Addenda: 0 Note Initiated On: 05/18/2015 10:24 AM Scope Withdrawal Time: 0 hours 10 minutes 25 seconds  Total Procedure Duration: 0 hours 17 minutes 35 seconds       [facility]

## 2015-05-18 NOTE — Anesthesia Postprocedure Evaluation (Signed)
Anesthesia Post Note  Patient: Henry Gallagher  Procedure(s) Performed: Procedure(s) (LRB): COLONOSCOPY WITH PROPOFOL (N/A)  Patient location during evaluation: Endoscopy Anesthesia Type: General Level of consciousness: awake and alert Pain management: pain level controlled Vital Signs Assessment: post-procedure vital signs reviewed and stable Respiratory status: spontaneous breathing, nonlabored ventilation, respiratory function stable and patient connected to nasal cannula oxygen Cardiovascular status: blood pressure returned to baseline and stable Postop Assessment: no signs of nausea or vomiting Anesthetic complications: no    Last Vitals:  Filed Vitals:   05/18/15 1112 05/18/15 1122  BP: 137/82 143/78  Pulse: 64 57  Temp:    Resp: 19 16    Last Pain:  Filed Vitals:   05/18/15 1123  PainSc: Asleep                 Martha Clan

## 2015-05-19 ENCOUNTER — Encounter: Payer: Self-pay | Admitting: Gastroenterology

## 2015-05-20 LAB — SURGICAL PATHOLOGY

## 2015-11-10 IMAGING — CR DG CHEST 1V PORT
1 series · 1 of 1 positions shown · non-contrast
Comparison: 10/08/2012

CLINICAL DATA: Chest pain shortness of breath.

EXAM:
PORTABLE CHEST - 1 VIEW

[ap]
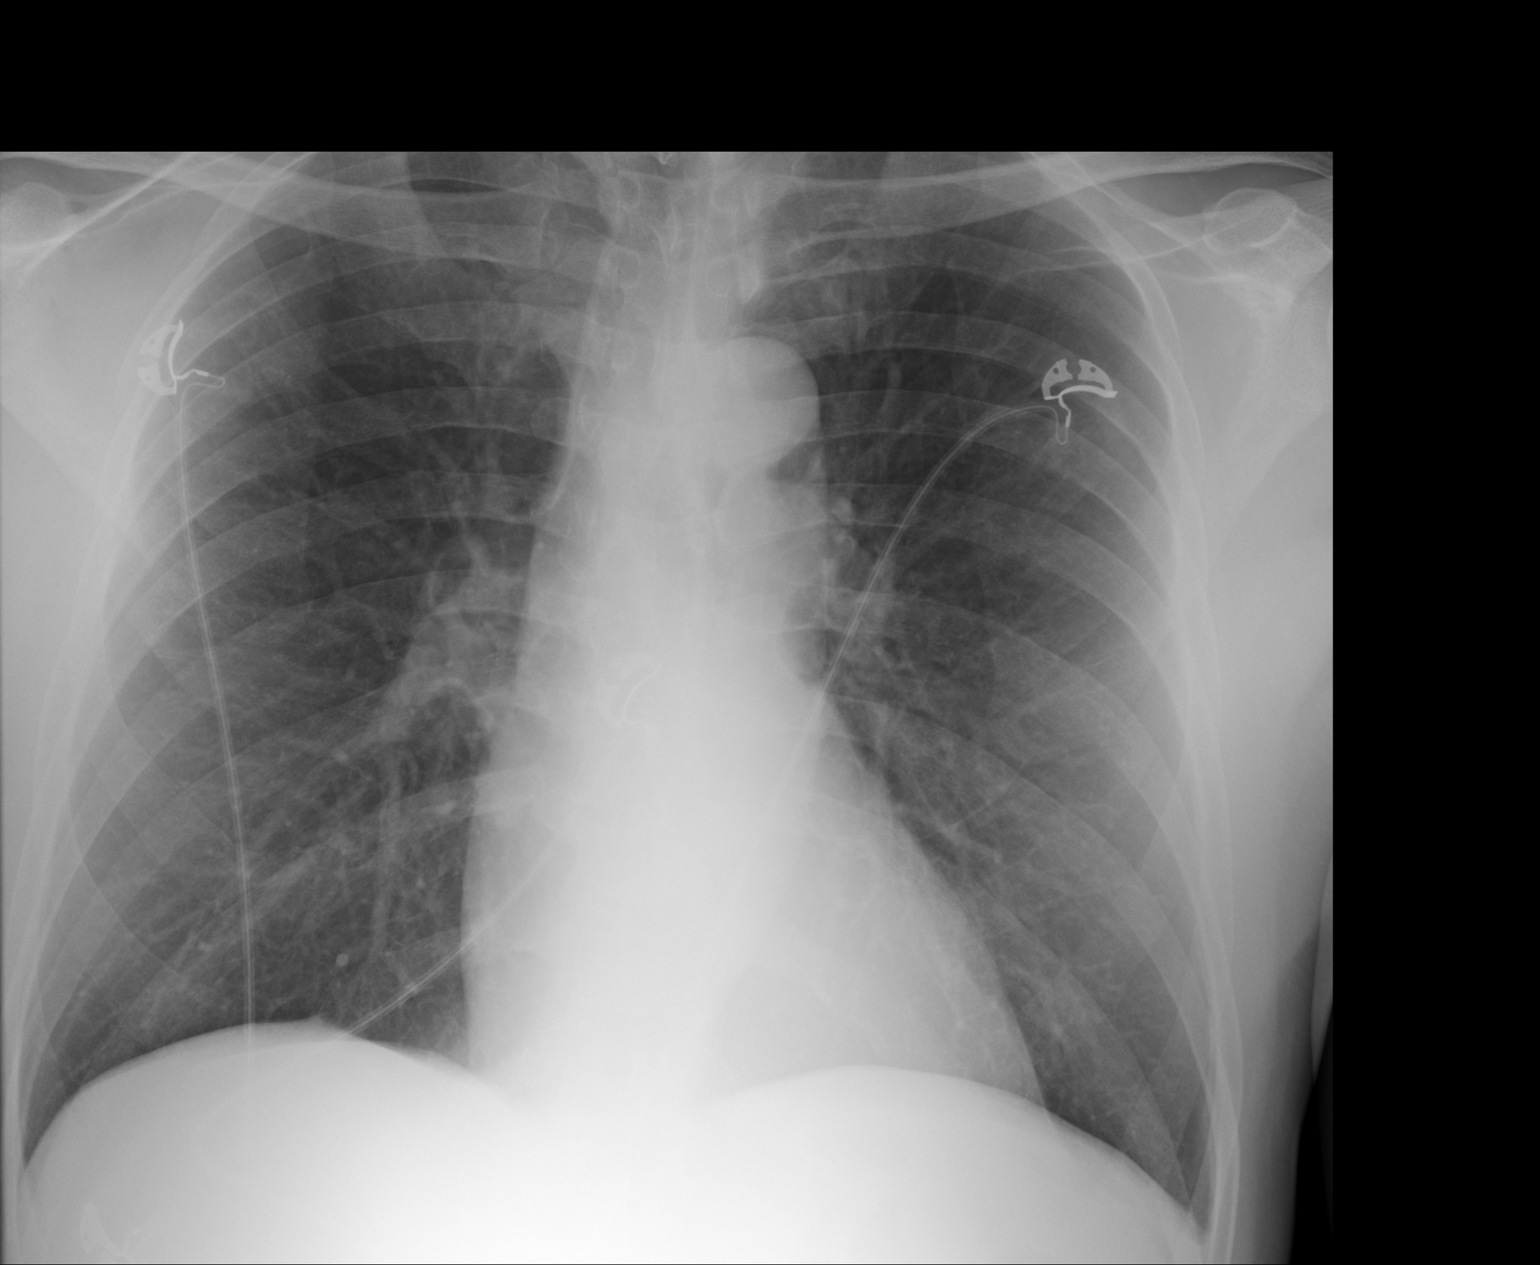

[1 of 1 positions shown; findings below may reference images not displayed]

FINDINGS: The heart size and mediastinal contours are within normal limits.
Both lungs are clear. The visualized skeletal structures are
unremarkable.
IMPRESSION: No active disease.

## 2022-07-28 ENCOUNTER — Ambulatory Visit: Payer: Medicaid Other | Admitting: Cardiovascular Disease

## 2022-07-28 ENCOUNTER — Encounter: Payer: Self-pay | Admitting: Cardiovascular Disease

## 2022-07-28 VITALS — BP 122/76 | HR 52 | Ht 73.0 in | Wt 159.8 lb

## 2022-07-28 DIAGNOSIS — I351 Nonrheumatic aortic (valve) insufficiency: Secondary | ICD-10-CM

## 2022-07-28 DIAGNOSIS — E782 Mixed hyperlipidemia: Secondary | ICD-10-CM | POA: Diagnosis not present

## 2022-07-28 DIAGNOSIS — I10 Essential (primary) hypertension: Secondary | ICD-10-CM

## 2022-07-28 DIAGNOSIS — I5033 Acute on chronic diastolic (congestive) heart failure: Secondary | ICD-10-CM | POA: Diagnosis not present

## 2022-07-28 NOTE — Progress Notes (Signed)
Cardiology Office Note   Date:  07/28/2022   ID:  Henry Gallagher, DOB [birth year], MRN [id number]  PCP:  Sherron Monday, MD  Cardiologist:  Adrian Blackwater, MD      History of Present Illness: Henry Gallagher is a 62 y.o. male who presents for  Chief Complaint  Patient presents with   Follow-up    3 month follow up    Feels fine      Past Medical History:  Diagnosis Date   Arthritis    Benign prostatic hypertrophy    CHF (congestive heart failure)    Chronic kidney disease    Depression    Hyperlipidemia    Hypertension      Past Surgical History:  Procedure Laterality Date   COLONOSCOPY WITH PROPOFOL N/A 05/18/2015   Procedure: COLONOSCOPY WITH PROPOFOL;  Surgeon: Wallace Cullens, MD;  Location: [facility] ENDOSCOPY;  Service: Gastroenterology;  Laterality: N/A;   TONSILLECTOMY AND ADENOIDECTOMY       Current Outpatient Medications  Medication Sig Dispense Refill   amLODipine (NORVASC) 10 MG tablet Take 10 mg by mouth daily.     aspirin EC 81 MG tablet Take 81 mg by mouth daily.     atorvastatin (LIPITOR) 80 MG tablet Take 80 mg by mouth daily.     carvedilol (COREG) 12.5 MG tablet Take 12.5 mg by mouth 2 (two) times daily with a meal.     chlorthalidone (HYGROTON) 25 MG tablet Take 25 mg by mouth daily.     lisinopril (ZESTRIL) 40 MG tablet Take 40 mg by mouth daily.     hydrALAZINE (APRESOLINE) 50 MG tablet Take 50 mg by mouth 3 (three) times daily. (Patient not taking: Reported on 07/28/2022)     No current facility-administered medications for this visit.    Allergies:   Patient has no known allergies.    Social History:   reports that he has been smoking. He does not have any smokeless tobacco history on file. No history on file for alcohol use and drug use.   Family History:  family history is not on file.    ROS:     Review of Systems  Constitutional: Negative.   HENT: Negative.    Eyes: Negative.   Respiratory: Negative.    Gastrointestinal: Negative.    Genitourinary: Negative.   Musculoskeletal: Negative.   Skin: Negative.   Neurological: Negative.   Endo/Heme/Allergies: Negative.   Psychiatric/Behavioral: Negative.    All other systems reviewed and are negative.     All other systems are reviewed and negative.    PHYSICAL EXAM: VS:  BP 122/76   Pulse (!) 52   Ht 6\' 1"  (1.854 m)   Wt 159 lb 12.8 oz (72.5 kg)   SpO2 98%   BMI 21.08 kg/m  , BMI Body mass index is 21.08 kg/m. Last weight:  Wt Readings from Last 3 Encounters:  07/28/22 159 lb 12.8 oz (72.5 kg)  05/18/15 187 lb (84.8 kg)     Physical Exam Vitals reviewed.  Constitutional:      Appearance: Normal appearance. He is normal weight.  HENT:     Head: Normocephalic.     Nose: Nose normal.     Mouth/Throat:     Mouth: Mucous membranes are moist.  Eyes:     Pupils: Pupils are equal, round, and reactive to light.  Cardiovascular:     Rate and Rhythm: Normal rate and regular rhythm.     Pulses: Normal  pulses.     Heart sounds: Normal heart sounds.  Pulmonary:     Effort: Pulmonary effort is normal.  Abdominal:     General: Abdomen is flat. Bowel sounds are normal.  Musculoskeletal:        General: Normal range of motion.     Cervical back: Normal range of motion.  Skin:    General: Skin is warm.  Neurological:     General: No focal deficit present.     Mental Status: He is alert.  Psychiatric:        Mood and Affect: Mood normal.       EKG:   Recent Labs: No results found for requested labs within last 365 days.    Lipid Panel No results found for: "CHOL", "TRIG", "HDL", "CHOLHDL", "VLDL", "LDLCALC", "LDLDIRECT"   REASON FOR VISIT  Visit for: Echocardiogram/I50.9  Sex:   Male         wt= 152   lbs.  BP=120/66  Height= 73   inches.        TESTS  Imaging: Echocardiogram:  An echocardiogram in (2-d) mode was performed and in Doppler mode with color flow velocity mapping was performed. The aortic valve cusps are abnormal 2.2  cm,  flow velocity 1.63  m/s, and systolic calculated mean flow gradient 3   mmHg. Mitral valve diastolic peak flow velocity E .724    m/s and E/A ratio 1.5. Aortic root diameter 3.3  cm. The LVOT internal diameter 3.6   cm and flow velocity was abnormal 1.07   m/s. LV systolic dimension 3.07   cm, diastolic 4.26   cm, posterior wall thickness 1.21     cm, fractional shortening 27.9  %, and EF 54.5  %. IVS thickness 1.34  cm. LA dimension 3.7 cm. Aortic Valve has Moderate-Severe Regurgitation. Mitral Valve has Mild Regurgitation. Pulmonic Valve has Trace Regurgitation. Tricuspid Valve has Mild Regurgitation.     ASSESSMENT  Technically adequate study.  Normal chamber sizes.  Normal left ventricular systolic function.  Normal right ventricular systolic function.  Normal right ventricular diastolic function.  Normal left ventricular wall motion.  Normal right ventricular wall motion.  Trace pulmonary regurgitation.  Mild tricuspid regurgitation.  Mild pulmonary hypertension.  Mild mitral regurgitation.  Moderate to severe aortic regurgitation.  No pericardial effusion.  Mildly dilated Left atrium  Mild LVH.     THERAPY   Referring physician: Laurier Nancy  Sonographer: Adrian Blackwater.      Adrian Blackwater MD  Electronically signed by: Adrian Blackwater     Date: 02/17/2022 10:07 TESTS                                                                                          [facility] [address] [place], [place] [address] [phone] STUDY:  Gated Stress / Rest Myocardial Perfusion Imaging Tomographic (SPECT) Including attenuation correction Wall Motion, Left Ventricular Ejection Fraction By Gated Technique.Treadmill Stress Test. SEX: Male  WEIGHT:  155 lbs   HEIGHT:  73 in   ARMS UP: YES/NO  REFERRING PHYSICIAN: Dr.Eriko Economos [place] Flakes                                                                                                                                                                                                                       INDICATION FOR STUDY: Heart Failure                                                                                                                                                                                                                     TECHNIQUE:  Approximately 20 minutes following the intravenous administration of 11.5 mCi of Tc-64m Sestamibi after stress testing in a reclined supine position with arms above their head if able to do so, gated SPECT imaging of the heart was performed. After about a 2hr break, the patient was injected intravenously with 31.8 mCi of Tc-71m Sestamibi.  Approximately 45 minutes later in the same position as stress imaging SPECT rest imaging of the heart was performed.  STRESS BY:  Adrian Blackwater, MD PROTOCOL:  Smitty Cords  MAX PRED HR: 157                     85%:  133              75%: 118                                                                                                                 RESTING BP:  108/62  RESTING HR: 80  PEAK BP: 128/82   PEAK HR: 118 (75%)                                                                    EXERCISE DURATION:  5:02                                            METS: 6.0     REASON FOR TEST TERMINATION:  Fatigue                                                                                                                                SYMPTOMS: Fatigue [facility] TREADMILL SCORE: 5                                       RISK: Low  EKG RESULTS:  NSR. 80/min. Old anteroseptal MI. No significant ST  changes at peak exercise.                                                             IMAGE QUALITY:  Good                                                                                                                                                                                                                                                                                                                                 PERFUSION/WALL MOTION FINDINGS:  EF = 63%. Small mild fixed mid inferior and inferolateral wall defects, normal wall motion.                                                                        IMPRESSION:  Above defect most likely due to diaphragmatic attenuation artifact with normal LVEF, probable normal stress test.  Adrian Blackwater, MD Stress Interpreting Physician / Nuclear Interpreting Physician                         Adrian Blackwater MD  Electronically signed by: Adrian Blackwater     Date: 02/24/2022 10:02  Other studies Reviewed: Additional studies/ records that were reviewed today include:  Review of the above records demonstrates:       No data to display            ASSESSMENT AND PLAN:    ICD-10-CM   1. 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 Primary hypertension  I10     3. Mixed hyperlipidemia  E78.2     4. CHF (congestive heart failure), NYHA class III, acute on chronic, diastolic  I50.33    Compensated now with no SOB       Problem List Items Addressed This Visit   None Visit Diagnoses     Nonrheumatic aortic valve insufficiency    -  Primary   moderate to severe but has no symptoms with BP  well controlled   Relevant Medications   lisinopril (ZESTRIL) 40 MG tablet   carvedilol (COREG) 12.5 MG tablet   atorvastatin (LIPITOR) 80 MG tablet   amLODipine (NORVASC) 10 MG tablet   chlorthalidone (HYGROTON) 25 MG tablet   Primary hypertension       Relevant Medications   lisinopril (ZESTRIL) 40 MG tablet   carvedilol (COREG) 12.5 MG tablet   atorvastatin (LIPITOR) 80 MG tablet   amLODipine (NORVASC) 10 MG tablet   chlorthalidone (HYGROTON) 25 MG tablet   Mixed hyperlipidemia       Relevant Medications   lisinopril (ZESTRIL) 40 MG tablet   carvedilol (COREG) 12.5 MG tablet   atorvastatin (LIPITOR) 80 MG tablet   amLODipine (NORVASC) 10 MG tablet   chlorthalidone (HYGROTON) 25 MG tablet   CHF (congestive heart failure), NYHA class III, acute on chronic, diastolic       Compensated now with no SOB   Relevant Medications   lisinopril (ZESTRIL) 40 MG tablet   carvedilol (COREG) 12.5 MG tablet   atorvastatin (LIPITOR) 80 MG tablet   amLODipine (NORVASC) 10 MG tablet   chlorthalidone (HYGROTON) 25 MG tablet          Disposition:   Return in about 3 months (around 10/27/2022).    Total time spent: 30 minutes  Signed,  Adrian Blackwater, MD  07/28/2022 9:21 AM    [facility]

## 2022-09-05 ENCOUNTER — Ambulatory Visit: Payer: Medicaid Other | Admitting: Internal Medicine

## 2022-09-05 ENCOUNTER — Encounter: Payer: Self-pay | Admitting: Internal Medicine

## 2022-09-05 VITALS — BP 110/72 | HR 63 | Ht 73.0 in | Wt 169.0 lb

## 2022-09-05 DIAGNOSIS — F17210 Nicotine dependence, cigarettes, uncomplicated: Secondary | ICD-10-CM

## 2022-09-05 DIAGNOSIS — Z860100 Personal history of colon polyps, unspecified: Secondary | ICD-10-CM | POA: Insufficient documentation

## 2022-09-05 DIAGNOSIS — E782 Mixed hyperlipidemia: Secondary | ICD-10-CM

## 2022-09-05 DIAGNOSIS — Z122 Encounter for screening for malignant neoplasm of respiratory organs: Secondary | ICD-10-CM | POA: Diagnosis not present

## 2022-09-05 DIAGNOSIS — I10 Essential (primary) hypertension: Secondary | ICD-10-CM

## 2022-09-05 DIAGNOSIS — I5033 Acute on chronic diastolic (congestive) heart failure: Secondary | ICD-10-CM

## 2022-09-05 NOTE — Progress Notes (Signed)
Established Patient Office Visit  Subjective:  Patient ID: Henry Gallagher, male    DOB: Oct 04, 1960  Age: 62 y.o. MRN: [id number]  Chief Complaint  Patient presents with   Follow-up    4 month follow up    Patient comes in for his 16-month follow-up.  He reports that he is generally feeling very well and has no complaints of chest pain or shortness of breath.  At his last visit he was advised to come back fasting for his blood work, as well as to get low-dose CT chest for lung cancer screening as he continues to smoke.  He was also referred to GI for colonoscopy as he has a personal history of colon polyps.  However he has not done any of that. Will get his  blood work today. Schedule GI referral for colonoscopy. Set up low-dose CT chest for lung cancer screening with history of nicotine dependence.    No other concerns at this time.   Past Medical History:  Diagnosis Date   Arthritis    Benign prostatic hypertrophy    CHF (congestive heart failure) (HCC)    Chronic kidney disease    Depression    Hyperlipidemia    Hypertension     Past Surgical History:  Procedure Laterality Date   COLONOSCOPY WITH PROPOFOL N/A 05/18/2015   Procedure: COLONOSCOPY WITH PROPOFOL;  Surgeon: Wallace Cullens, MD;  Location: [facility] ENDOSCOPY;  Service: Gastroenterology;  Laterality: N/A;   TONSILLECTOMY AND ADENOIDECTOMY      Social History   Socioeconomic History   Marital status: Divorced    Spouse name: Not on file   Number of children: Not on file   Years of education: Not on file   Highest education level: Not on file  Occupational History   Not on file  Tobacco Use   Smoking status: Every Day   Smokeless tobacco: Not on file  Substance and Sexual Activity   Alcohol use: Not on file   Drug use: Not on file   Sexual activity: Not on file  Other Topics Concern   Not on file  Social History Narrative   Not on file   Social Determinants of Health   Financial Resource Strain: Not on file   Food Insecurity: Not on file  Transportation Needs: Not on file  Physical Activity: Not on file  Stress: Not on file  Social Connections: Not on file  Intimate Partner Violence: Not on file    History reviewed. No pertinent family history.  No Known Allergies  Review of Systems  Constitutional:  Negative for chills, diaphoresis, fever, malaise/fatigue and weight loss.  HENT: Negative.    Eyes: Negative.   Respiratory:  Negative for cough, shortness of breath and wheezing.   Cardiovascular:  Negative for chest pain, palpitations, orthopnea, leg swelling and PND.  Gastrointestinal:  Negative for blood in stool, heartburn, melena, nausea and vomiting.  Genitourinary:  Negative for dysuria, hematuria and urgency.  Musculoskeletal:  Negative for back pain, joint pain, myalgias and neck pain.  Skin: Negative.   Neurological:  Negative for dizziness, tremors, focal weakness, weakness and headaches.  Psychiatric/Behavioral:  Negative for depression and memory loss. The patient is not nervous/anxious.        Objective:   BP 110/72   Pulse 63   Ht 6\' 1"  (1.854 m)   Wt 169 lb (76.7 kg)   SpO2 99%   BMI 22.30 kg/m   Vitals:   09/05/22 0851  BP: 110/72  Pulse: 63  Height: 6\' 1"  (1.854 m)  Weight: 169 lb (76.7 kg)  SpO2: 99%  BMI (Calculated): 22.3    Physical Exam Vitals and nursing note reviewed.  Constitutional:      Appearance: Normal appearance.  HENT:     Head: Normocephalic and atraumatic.     Nose: No congestion or rhinorrhea.     Mouth/Throat:     Pharynx: No oropharyngeal exudate or posterior oropharyngeal erythema.  Neck:     Vascular: No carotid bruit.  Cardiovascular:     Rate and Rhythm: Normal rate and regular rhythm.     Pulses: Normal pulses.     Heart sounds: Normal heart sounds.  Pulmonary:     Effort: Pulmonary effort is normal.     Breath sounds: Normal breath sounds.  Abdominal:     General: Bowel sounds are normal.     Palpations: Abdomen  is soft.     Tenderness: There is no right CVA tenderness, left CVA tenderness, guarding or rebound.  Musculoskeletal:     Cervical back: Normal range of motion and neck supple. No rigidity or tenderness.     Right lower leg: No edema.     Left lower leg: No edema.  Lymphadenopathy:     Cervical: No cervical adenopathy.  Skin:    General: Skin is warm.     Findings: No lesion or rash.  Neurological:     General: No focal deficit present.     Mental Status: He is alert and oriented to person, place, and time.  Psychiatric:        Mood and Affect: Mood normal.        Behavior: Behavior normal.      No results found for any visits on 09/05/22.  No results found for this or any previous visit (from the past 2160 hour(s)).    Assessment & Plan:  Check labs today.  Schedule a GI referral as well as a CT chest. Problem List Items Addressed This Visit     Essential hypertension, benign - Primary   Relevant Orders   CMP14+EGFR   CHF (congestive heart failure), NYHA class III, acute on chronic, diastolic (HCC)   Relevant Orders   CBC With Differential   Mixed hyperlipidemia   Relevant Orders   Lipid Panel w/o Chol/HDL Ratio   Personal history of colonic polyps   Relevant Orders   Ambulatory referral to Gastroenterology   Screening for lung cancer   Relevant Orders   CT CHEST WO CONTRAST   Cigarette nicotine dependence without complication    Return in about 4 months (around 01/06/2023).   Total time spent: 30 minutes  [name], MD  09/05/2022   This document may have been prepared by [facility] Voice Recognition software and as such may include unintentional dictation errors.

## 2022-09-20 ENCOUNTER — Ambulatory Visit (INDEPENDENT_AMBULATORY_CARE_PROVIDER_SITE_OTHER): Payer: Medicaid Other

## 2022-09-20 DIAGNOSIS — I5033 Acute on chronic diastolic (congestive) heart failure: Secondary | ICD-10-CM | POA: Diagnosis not present

## 2022-09-20 DIAGNOSIS — Z122 Encounter for screening for malignant neoplasm of respiratory organs: Secondary | ICD-10-CM

## 2022-09-22 NOTE — Progress Notes (Signed)
Patient notified

## 2022-10-27 ENCOUNTER — Ambulatory Visit: Payer: Medicaid Other | Admitting: Cardiovascular Disease

## 2022-10-27 ENCOUNTER — Encounter: Payer: Self-pay | Admitting: Cardiovascular Disease

## 2022-10-27 VITALS — BP 110/76 | HR 54 | Ht 73.0 in | Wt 164.8 lb

## 2022-10-27 DIAGNOSIS — F17210 Nicotine dependence, cigarettes, uncomplicated: Secondary | ICD-10-CM | POA: Diagnosis not present

## 2022-10-27 DIAGNOSIS — I340 Nonrheumatic mitral (valve) insufficiency: Secondary | ICD-10-CM

## 2022-10-27 DIAGNOSIS — I10 Essential (primary) hypertension: Secondary | ICD-10-CM

## 2022-10-27 DIAGNOSIS — E782 Mixed hyperlipidemia: Secondary | ICD-10-CM | POA: Diagnosis not present

## 2022-10-27 DIAGNOSIS — I5033 Acute on chronic diastolic (congestive) heart failure: Secondary | ICD-10-CM

## 2022-10-27 DIAGNOSIS — I351 Nonrheumatic aortic (valve) insufficiency: Secondary | ICD-10-CM

## 2022-10-27 NOTE — Progress Notes (Signed)
Cardiology Office Note   Date:  10/27/2022   ID:  Henry Gallagher, DOB January 25, 1961, MRN [id number]  PCP:  [name], MD  Cardiologist:  Adrian Blackwater, MD      History of Present Illness: Henry Gallagher is a 62 y.o. male who presents for  Chief Complaint  Patient presents with   Follow-up    4mo F/U    Doing well      Past Medical History:  Diagnosis Date   Arthritis    Benign prostatic hypertrophy    CHF (congestive heart failure) (HCC)    Chronic kidney disease    Depression    Hyperlipidemia    Hypertension      Past Surgical History:  Procedure Laterality Date   COLONOSCOPY WITH PROPOFOL N/A 05/18/2015   Procedure: COLONOSCOPY WITH PROPOFOL;  Surgeon: Wallace Cullens, MD;  Location: [facility] ENDOSCOPY;  Service: Gastroenterology;  Laterality: N/A;   TONSILLECTOMY AND ADENOIDECTOMY       Current Outpatient Medications  Medication Sig Dispense Refill   amLODipine (NORVASC) 10 MG tablet Take 10 mg by mouth daily.     aspirin EC 81 MG tablet Take 81 mg by mouth daily.     atorvastatin (LIPITOR) 80 MG tablet Take 80 mg by mouth daily.     carvedilol (COREG) 12.5 MG tablet Take 12.5 mg by mouth 2 (two) times daily with a meal.     chlorthalidone (HYGROTON) 25 MG tablet Take 25 mg by mouth daily.     hydrALAZINE (APRESOLINE) 50 MG tablet Take 50 mg by mouth 3 (three) times daily. (Patient not taking: Reported on 07/28/2022)     lisinopril (ZESTRIL) 40 MG tablet Take 40 mg by mouth daily.     No current facility-administered medications for this visit.    Allergies:   Patient has no known allergies.    Social History:   reports that he has been smoking. He does not have any smokeless tobacco history on file. No history on file for alcohol use and drug use.   Family History:  family history is not on file.    ROS:     Review of Systems  Constitutional: Negative.   HENT: Negative.    Eyes: Negative.   Respiratory: Negative.    Gastrointestinal: Negative.    Genitourinary: Negative.   Musculoskeletal: Negative.   Skin: Negative.   Neurological: Negative.   Endo/Heme/Allergies: Negative.   Psychiatric/Behavioral: Negative.    All other systems reviewed and are negative.     All other systems are reviewed and negative.    PHYSICAL EXAM: VS:  BP 110/76   Pulse (!) 54   Ht 6\' 1"  (1.854 m)   Wt 164 lb 12.8 oz (74.8 kg)   SpO2 99%   BMI 21.74 kg/m  , BMI Body mass index is 21.74 kg/m. Last weight:  Wt Readings from Last 3 Encounters:  10/27/22 164 lb 12.8 oz (74.8 kg)  09/05/22 169 lb (76.7 kg)  07/28/22 159 lb 12.8 oz (72.5 kg)     Physical Exam Vitals reviewed.  Constitutional:      Appearance: Normal appearance. He is normal weight.  HENT:     Head: Normocephalic.     Nose: Nose normal.     Mouth/Throat:     Mouth: Mucous membranes are moist.  Eyes:     Pupils: Pupils are equal, round, and reactive to light.  Cardiovascular:     Rate and Rhythm: Normal rate and regular  rhythm.     Pulses: Normal pulses.     Heart sounds: Normal heart sounds.  Pulmonary:     Effort: Pulmonary effort is normal.  Abdominal:     General: Abdomen is flat. Bowel sounds are normal.  Musculoskeletal:        General: Normal range of motion.     Cervical back: Normal range of motion.  Skin:    General: Skin is warm.  Neurological:     General: No focal deficit present.     Mental Status: He is alert.  Psychiatric:        Mood and Affect: Mood normal.       EKG:   Recent Labs: No results found for requested labs within last 365 days.    Lipid Panel No results found for: "CHOL", "TRIG", "HDL", "CHOLHDL", "VLDL", "LDLCALC", "LDLDIRECT"    Other studies Reviewed: Additional studies/ records that were reviewed today include:  Review of the above records demonstrates:       No data to display            ASSESSMENT AND PLAN:    ICD-10-CM   1. CHF (congestive heart failure), NYHA class III, acute on chronic, diastolic  (HCC)  Q46.96 PCV ECHOCARDIOGRAM COMPLETE    2. Essential hypertension, benign  I10 PCV ECHOCARDIOGRAM COMPLETE    3. Cigarette nicotine dependence without complication  F17.210 PCV ECHOCARDIOGRAM COMPLETE    4. Mixed hyperlipidemia  E78.2 PCV ECHOCARDIOGRAM COMPLETE    5. Nonrheumatic mitral valve regurgitation  I34.0 PCV ECHOCARDIOGRAM COMPLETE   mild    6. Nonrheumatic aortic valve insufficiency  I35.1 PCV ECHOCARDIOGRAM COMPLETE   Moderate to sever       Problem List Items Addressed This Visit       Cardiovascular and Mediastinum   Essential hypertension, benign   Relevant Orders   PCV ECHOCARDIOGRAM COMPLETE   CHF (congestive heart failure), NYHA class III, acute on chronic, diastolic (HCC) - Primary   Relevant Orders   PCV ECHOCARDIOGRAM COMPLETE     Other   Mixed hyperlipidemia   Relevant Orders   PCV ECHOCARDIOGRAM COMPLETE   Cigarette nicotine dependence without complication   Relevant Orders   PCV ECHOCARDIOGRAM COMPLETE   Other Visit Diagnoses     Nonrheumatic mitral valve regurgitation       mild   Relevant Orders   PCV ECHOCARDIOGRAM COMPLETE   Nonrheumatic aortic valve insufficiency       Moderate to sever   Relevant Orders   PCV ECHOCARDIOGRAM COMPLETE          Disposition:   Return in about 5 weeks (around 12/01/2022) for echo and f/u.    Total time spent: 35 minutes  Signed,  Adrian Blackwater, MD  10/27/2022 9:22 AM    [facility]

## 2022-11-17 ENCOUNTER — Other Ambulatory Visit: Payer: Medicaid Other

## 2022-11-21 ENCOUNTER — Ambulatory Visit: Payer: Medicaid Other | Admitting: Cardiovascular Disease

## 2022-11-24 ENCOUNTER — Ambulatory Visit (INDEPENDENT_AMBULATORY_CARE_PROVIDER_SITE_OTHER): Payer: Medicaid Other

## 2022-11-24 DIAGNOSIS — I340 Nonrheumatic mitral (valve) insufficiency: Secondary | ICD-10-CM | POA: Diagnosis not present

## 2022-11-24 DIAGNOSIS — E782 Mixed hyperlipidemia: Secondary | ICD-10-CM

## 2022-11-24 DIAGNOSIS — F17210 Nicotine dependence, cigarettes, uncomplicated: Secondary | ICD-10-CM

## 2022-11-24 DIAGNOSIS — I351 Nonrheumatic aortic (valve) insufficiency: Secondary | ICD-10-CM

## 2022-11-24 DIAGNOSIS — I361 Nonrheumatic tricuspid (valve) insufficiency: Secondary | ICD-10-CM | POA: Diagnosis not present

## 2022-11-24 DIAGNOSIS — I5033 Acute on chronic diastolic (congestive) heart failure: Secondary | ICD-10-CM

## 2022-11-24 DIAGNOSIS — I10 Essential (primary) hypertension: Secondary | ICD-10-CM

## 2022-11-25 ENCOUNTER — Encounter: Payer: Self-pay | Admitting: Cardiovascular Disease

## 2022-11-25 ENCOUNTER — Ambulatory Visit: Payer: Medicaid Other | Admitting: Cardiovascular Disease

## 2022-11-25 VITALS — BP 112/70 | HR 55 | Ht 73.0 in | Wt 165.6 lb

## 2022-11-25 DIAGNOSIS — E782 Mixed hyperlipidemia: Secondary | ICD-10-CM

## 2022-11-25 DIAGNOSIS — I5033 Acute on chronic diastolic (congestive) heart failure: Secondary | ICD-10-CM

## 2022-11-25 DIAGNOSIS — F17210 Nicotine dependence, cigarettes, uncomplicated: Secondary | ICD-10-CM

## 2022-11-25 DIAGNOSIS — I340 Nonrheumatic mitral (valve) insufficiency: Secondary | ICD-10-CM

## 2022-11-25 DIAGNOSIS — I351 Nonrheumatic aortic (valve) insufficiency: Secondary | ICD-10-CM

## 2022-11-25 DIAGNOSIS — I10 Essential (primary) hypertension: Secondary | ICD-10-CM | POA: Diagnosis not present

## 2022-11-25 NOTE — Progress Notes (Signed)
Cardiology Office Note   Date:  11/25/2022   ID:  FAHED GAFFKE, DOB 1960/11/04, MRN [id number]  PCP:  [name], MD  Cardiologist:  Adrian Blackwater, MD      History of Present Illness: Henry Gallagher is a 62 y.o. male who presents for  Chief Complaint  Patient presents with   Follow-up    4 week follow up, Echo results.    Doing well      Past Medical History:  Diagnosis Date   Arthritis    Benign prostatic hypertrophy    CHF (congestive heart failure) (HCC)    Chronic kidney disease    Depression    Hyperlipidemia    Hypertension      Past Surgical History:  Procedure Laterality Date   COLONOSCOPY WITH PROPOFOL N/A 05/18/2015   Procedure: COLONOSCOPY WITH PROPOFOL;  Surgeon: Wallace Cullens, MD;  Location: [facility] ENDOSCOPY;  Service: Gastroenterology;  Laterality: N/A;   TONSILLECTOMY AND ADENOIDECTOMY       Current Outpatient Medications  Medication Sig Dispense Refill   amLODipine (NORVASC) 10 MG tablet Take 10 mg by mouth daily.     aspirin EC 81 MG tablet Take 81 mg by mouth daily.     atorvastatin (LIPITOR) 80 MG tablet Take 80 mg by mouth daily.     carvedilol (COREG) 12.5 MG tablet Take 12.5 mg by mouth 2 (two) times daily with a meal.     chlorthalidone (HYGROTON) 25 MG tablet Take 25 mg by mouth daily.     lisinopril (ZESTRIL) 40 MG tablet Take 40 mg by mouth daily.     hydrALAZINE (APRESOLINE) 50 MG tablet Take 50 mg by mouth 3 (three) times daily. (Patient not taking: Reported on 07/28/2022)     No current facility-administered medications for this visit.    Allergies:   Patient has no known allergies.    Social History:   reports that he has been smoking. He does not have any smokeless tobacco history on file. No history on file for alcohol use and drug use.   Family History:  family history is not on file.    ROS:     Review of Systems  Constitutional: Negative.   HENT: Negative.    Eyes: Negative.   Respiratory: Negative.     Gastrointestinal: Negative.   Genitourinary: Negative.   Musculoskeletal: Negative.   Skin: Negative.   Neurological: Negative.   Endo/Heme/Allergies: Negative.   Psychiatric/Behavioral: Negative.    All other systems reviewed and are negative.     All other systems are reviewed and negative.    PHYSICAL EXAM: VS:  BP 112/70   Pulse (!) 55   Ht 6\' 1"  (1.854 m)   Wt 165 lb 9.6 oz (75.1 kg)   SpO2 93%   BMI 21.85 kg/m  , BMI Body mass index is 21.85 kg/m. Last weight:  Wt Readings from Last 3 Encounters:  11/25/22 165 lb 9.6 oz (75.1 kg)  10/27/22 164 lb 12.8 oz (74.8 kg)  09/05/22 169 lb (76.7 kg)     Physical Exam Vitals reviewed.  Constitutional:      Appearance: Normal appearance. He is normal weight.  HENT:     Head: Normocephalic.     Nose: Nose normal.     Mouth/Throat:     Mouth: Mucous membranes are moist.  Eyes:     Pupils: Pupils are equal, round, and reactive to light.  Cardiovascular:     Rate and Rhythm:  Normal rate and regular rhythm.     Pulses: Normal pulses.     Heart sounds: Normal heart sounds.  Pulmonary:     Effort: Pulmonary effort is normal.  Abdominal:     General: Abdomen is flat. Bowel sounds are normal.  Musculoskeletal:        General: Normal range of motion.     Cervical back: Normal range of motion.  Skin:    General: Skin is warm.  Neurological:     General: No focal deficit present.     Mental Status: He is alert.  Psychiatric:        Mood and Affect: Mood normal.       EKG:   Recent Labs: No results found for requested labs within last 365 days.    Lipid Panel No results found for: "CHOL", "TRIG", "HDL", "CHOLHDL", "VLDL", "LDLCALC", "LDLDIRECT"    Other studies Reviewed: Additional studies/ records that were reviewed today include:  Review of the above records demonstrates:       No data to display            ASSESSMENT AND PLAN:    ICD-10-CM   1. CHF (congestive heart failure), NYHA class  III, acute on chronic, diastolic (HCC)  I50.33    compensated    2. Essential hypertension, benign  I10     3. Cigarette nicotine dependence without complication  F17.210     4. Mixed hyperlipidemia  E78.2     5. Nonrheumatic mitral valve regurgitation  I34.0    mild    6. Nonrheumatic aortic valve insufficiency  I35.1    mild to moderate       Problem List Items Addressed This Visit       Cardiovascular and Mediastinum   Essential hypertension, benign   CHF (congestive heart failure), NYHA class III, acute on chronic, diastolic (HCC) - Primary     Other   Mixed hyperlipidemia   Cigarette nicotine dependence without complication   Other Visit Diagnoses     Nonrheumatic mitral valve regurgitation       mild   Nonrheumatic aortic valve insufficiency       mild to moderate          Disposition:   Return in about 3 months (around 02/25/2023).    Total time spent: 30 minutes  Signed,  Adrian Blackwater, MD  11/25/2022 9:51 AM    [facility]

## 2023-01-06 ENCOUNTER — Ambulatory Visit: Payer: Medicaid Other | Admitting: Internal Medicine

## 2023-01-06 ENCOUNTER — Encounter: Payer: Self-pay | Admitting: Internal Medicine

## 2023-01-06 VITALS — BP 110/70 | HR 57 | Ht 73.0 in | Wt 166.2 lb

## 2023-01-06 DIAGNOSIS — Z86010 Personal history of colonic polyps: Secondary | ICD-10-CM

## 2023-01-06 DIAGNOSIS — I10 Essential (primary) hypertension: Secondary | ICD-10-CM | POA: Diagnosis not present

## 2023-01-06 DIAGNOSIS — Z125 Encounter for screening for malignant neoplasm of prostate: Secondary | ICD-10-CM

## 2023-01-06 DIAGNOSIS — I5033 Acute on chronic diastolic (congestive) heart failure: Secondary | ICD-10-CM

## 2023-01-06 DIAGNOSIS — E782 Mixed hyperlipidemia: Secondary | ICD-10-CM

## 2023-01-06 NOTE — Progress Notes (Signed)
Established Patient Office Visit  Subjective:  Patient ID: Henry Gallagher, male    DOB: 09-14-1960  Age: 62 y.o. MRN: [id number]  Chief Complaint  Patient presents with   Follow-up    4 mo    Patient comes in for his follow-up today.  He is feeling well and has no new complaints.  He continues to smoke cigarettes but admits that he is cutting them down significantly.  His recent CT lung for screening was negative. He will get labs today. Still needs to see GI for his colonoscopy, he has a personal history of tubular adenomas seen on colonoscopy in 2017, will send another referral.    No other concerns at this time.   Past Medical History:  Diagnosis Date   Arthritis    Benign prostatic hypertrophy    CHF (congestive heart failure) (HCC)    Chronic kidney disease    Depression    Hyperlipidemia    Hypertension     Past Surgical History:  Procedure Laterality Date   COLONOSCOPY WITH PROPOFOL N/A 05/18/2015   Procedure: COLONOSCOPY WITH PROPOFOL;  Surgeon: Wallace Cullens, MD;  Location: [facility] ENDOSCOPY;  Service: Gastroenterology;  Laterality: N/A;   TONSILLECTOMY AND ADENOIDECTOMY      Social History   Socioeconomic History   Marital status: Divorced    Spouse name: Not on file   Number of children: Not on file   Years of education: Not on file   Highest education level: Not on file  Occupational History   Not on file  Tobacco Use   Smoking status: Every Day   Smokeless tobacco: Not on file  Substance and Sexual Activity   Alcohol use: Not on file   Drug use: Not on file   Sexual activity: Not on file  Other Topics Concern   Not on file  Social History Narrative   Not on file   Social Determinants of Health   Financial Resource Strain: Not on file  Food Insecurity: Not on file  Transportation Needs: Not on file  Physical Activity: Not on file  Stress: Not on file  Social Connections: Not on file  Intimate Partner Violence: Not on file    History reviewed.  No pertinent family history.  No Known Allergies  Review of Systems  Constitutional: Negative.  Negative for chills, fever and weight loss.  HENT: Negative.  Negative for sore throat.   Eyes: Negative.   Respiratory: Negative.  Negative for cough and shortness of breath.   Cardiovascular: Negative.  Negative for chest pain, palpitations and leg swelling.  Gastrointestinal: Negative.  Negative for abdominal pain, blood in stool, constipation, diarrhea, heartburn, melena, nausea and vomiting.  Genitourinary: Negative.  Negative for dysuria and flank pain.  Musculoskeletal: Negative.  Negative for joint pain and myalgias.  Skin: Negative.   Neurological: Negative.  Negative for dizziness and headaches.  Endo/Heme/Allergies: Negative.   Psychiatric/Behavioral: Negative.  Negative for depression and suicidal ideas. The patient is not nervous/anxious.        Objective:   BP 110/70   Pulse (!) 57   Ht 6\' 1"  (1.854 m)   Wt 166 lb 3.2 oz (75.4 kg)   SpO2 99%   BMI 21.93 kg/m   Vitals:   01/06/23 0901  BP: 110/70  Pulse: (!) 57  Height: 6\' 1"  (1.854 m)  Weight: 166 lb 3.2 oz (75.4 kg)  SpO2: 99%  BMI (Calculated): 21.93    Physical Exam Vitals and nursing note reviewed.  Constitutional:      General: He is not in acute distress.    Appearance: Normal appearance.  HENT:     Head: Normocephalic and atraumatic.     Nose: Nose normal.     Mouth/Throat:     Mouth: Mucous membranes are moist.     Pharynx: Oropharynx is clear.  Eyes:     Conjunctiva/sclera: Conjunctivae normal.     Pupils: Pupils are equal, round, and reactive to light.  Cardiovascular:     Rate and Rhythm: Normal rate and regular rhythm.     Pulses: Normal pulses.     Heart sounds: Normal heart sounds.  Pulmonary:     Effort: Pulmonary effort is normal.     Breath sounds: Normal breath sounds. No wheezing, rhonchi or rales.  Abdominal:     General: Bowel sounds are normal.     Palpations: Abdomen is  soft. There is no mass.     Tenderness: There is no abdominal tenderness. There is no right CVA tenderness, left CVA tenderness, guarding or rebound.     Hernia: No hernia is present.  Musculoskeletal:        General: Normal range of motion.     Cervical back: Normal range of motion.     Right lower leg: No edema.     Left lower leg: No edema.  Skin:    General: Skin is warm and dry.     Findings: No rash.  Neurological:     General: No focal deficit present.     Mental Status: He is alert and oriented to person, place, and time.  Psychiatric:        Mood and Affect: Mood normal.        Behavior: Behavior normal.        Judgment: Judgment normal.      No results found for any visits on 01/06/23.  No results found for this or any previous visit (from the past 2160 hour(s)).    Assessment & Plan:  Continue meds.  Check labs. GI consult. Problem List Items Addressed This Visit     Essential hypertension, benign - Primary   Relevant Orders   CMP14+EGFR   CHF (congestive heart failure), NYHA class III, acute on chronic, diastolic (HCC)   Relevant Orders   CBC with Diff   CMP14+EGFR   Mixed hyperlipidemia   Relevant Orders   Lipid Panel w/o Chol/HDL Ratio   Personal history of colonic polyps   Relevant Orders   CBC with Diff   Ambulatory referral to Gastroenterology   Other Visit Diagnoses     Screening for prostate cancer       Relevant Orders   PSA       Return in about 3 months (around 04/07/2023).   Total time spent: 30 minutes  [name], MD  01/06/2023   This document may have been prepared by [facility] Voice Recognition software and as such may include unintentional dictation errors.

## 2023-01-07 LAB — CMP14+EGFR
ALT: 20 [IU]/L (ref 0–44)
AST: 34 [IU]/L (ref 0–40)
Albumin: 4.2 g/dL (ref 3.9–4.9)
Alkaline Phosphatase: 90 [IU]/L (ref 44–121)
BUN/Creatinine Ratio: 13 (ref 10–24)
BUN: 24 mg/dL (ref 8–27)
Bilirubin Total: 0.4 mg/dL (ref 0.0–1.2)
CO2: 20 mmol/L (ref 20–29)
Calcium: 9.7 mg/dL (ref 8.6–10.2)
Chloride: 102 mmol/L (ref 96–106)
Creatinine, Ser: 1.82 mg/dL — ABNORMAL HIGH (ref 0.76–1.27)
Globulin, Total: 2.5 g/dL (ref 1.5–4.5)
Glucose: 81 mg/dL (ref 70–99)
Potassium: 4.9 mmol/L (ref 3.5–5.2)
Sodium: 139 mmol/L (ref 134–144)
Total Protein: 6.7 g/dL (ref 6.0–8.5)
eGFR: 41 mL/min/{1.73_m2} — ABNORMAL LOW (ref >59)

## 2023-01-07 LAB — CBC WITH DIFFERENTIAL/PLATELET
Basophils Absolute: 0 10*3/uL (ref 0.0–0.2)
Basos: 1 %
EOS (ABSOLUTE): 0.3 10*3/uL (ref 0.0–0.4)
Eos: 5 %
Hematocrit: 40.9 % (ref 37.5–51.0)
Hemoglobin: 13.4 g/dL (ref 13.0–17.7)
Immature Grans (Abs): 0 10*3/uL (ref 0.0–0.1)
Immature Granulocytes: 0 %
Lymphocytes Absolute: 2.2 10*3/uL (ref 0.7–3.1)
Lymphs: 45 %
MCH: 33.2 pg — ABNORMAL HIGH (ref 26.6–33.0)
MCHC: 32.8 g/dL (ref 31.5–35.7)
MCV: 101 fL — ABNORMAL HIGH (ref 79–97)
Monocytes Absolute: 0.5 10*3/uL (ref 0.1–0.9)
Monocytes: 11 %
Neutrophils Absolute: 1.9 10*3/uL (ref 1.4–7.0)
Neutrophils: 38 %
Platelets: 223 10*3/uL (ref 150–450)
RBC: 4.04 x10E6/uL — ABNORMAL LOW (ref 4.14–5.80)
RDW: 14.3 % (ref 11.6–15.4)
WBC: 4.9 10*3/uL (ref 3.4–10.8)

## 2023-01-07 LAB — PSA: Prostate Specific Ag, Serum: 0.8 ng/mL (ref 0.0–4.0)

## 2023-01-07 LAB — LIPID PANEL W/O CHOL/HDL RATIO
Cholesterol, Total: 124 mg/dL (ref 100–199)
HDL: 55 mg/dL (ref >39)
LDL Chol Calc (NIH): 29 mg/dL (ref 0–99)
Triglycerides: 270 mg/dL — ABNORMAL HIGH (ref 0–149)
VLDL Cholesterol Cal: 40 mg/dL (ref 5–40)

## 2023-02-28 ENCOUNTER — Ambulatory Visit: Payer: Medicaid Other | Admitting: Cardiovascular Disease

## 2023-02-28 ENCOUNTER — Encounter: Payer: Self-pay | Admitting: Cardiovascular Disease

## 2023-02-28 VITALS — BP 120/76 | HR 61 | Ht 72.0 in | Wt 163.2 lb

## 2023-02-28 DIAGNOSIS — I340 Nonrheumatic mitral (valve) insufficiency: Secondary | ICD-10-CM

## 2023-02-28 DIAGNOSIS — F17210 Nicotine dependence, cigarettes, uncomplicated: Secondary | ICD-10-CM

## 2023-02-28 DIAGNOSIS — E782 Mixed hyperlipidemia: Secondary | ICD-10-CM

## 2023-02-28 DIAGNOSIS — I10 Essential (primary) hypertension: Secondary | ICD-10-CM | POA: Diagnosis not present

## 2023-02-28 DIAGNOSIS — I351 Nonrheumatic aortic (valve) insufficiency: Secondary | ICD-10-CM

## 2023-02-28 DIAGNOSIS — I5033 Acute on chronic diastolic (congestive) heart failure: Secondary | ICD-10-CM | POA: Diagnosis not present

## 2023-02-28 NOTE — Progress Notes (Signed)
Cardiology Office Note   Date:  02/28/2023   ID:  Henry Gallagher, DOB 1960-09-16, MRN [id number]  PCP:  [name], MD  Cardiologist:  Adrian Blackwater, MD      History of Present Illness: Henry Gallagher is a 62 y.o. male who presents for  Chief Complaint  Patient presents with   Follow-up    3 month follow up    Feeling goos      Past Medical History:  Diagnosis Date   Arthritis    Benign prostatic hypertrophy    CHF (congestive heart failure) (HCC)    Chronic kidney disease    Depression    Hyperlipidemia    Hypertension      Past Surgical History:  Procedure Laterality Date   COLONOSCOPY WITH PROPOFOL N/A 05/18/2015   Procedure: COLONOSCOPY WITH PROPOFOL;  Surgeon: Wallace Cullens, MD;  Location: [facility] ENDOSCOPY;  Service: Gastroenterology;  Laterality: N/A;   TONSILLECTOMY AND ADENOIDECTOMY       Current Outpatient Medications  Medication Sig Dispense Refill   amLODipine (NORVASC) 10 MG tablet Take 10 mg by mouth daily.     aspirin EC 81 MG tablet Take 81 mg by mouth daily.     atorvastatin (LIPITOR) 80 MG tablet Take 80 mg by mouth daily.     carvedilol (COREG) 12.5 MG tablet Take 12.5 mg by mouth 2 (two) times daily with a meal.     chlorthalidone (HYGROTON) 25 MG tablet Take 25 mg by mouth daily.     hydrALAZINE (APRESOLINE) 50 MG tablet Take 50 mg by mouth 3 (three) times daily.     lisinopril (ZESTRIL) 40 MG tablet Take 40 mg by mouth daily.     No current facility-administered medications for this visit.    Allergies:   Patient has no known allergies.    Social History:   reports that he has been smoking. He does not have any smokeless tobacco history on file. No history on file for alcohol use and drug use.   Family History:  family history is not on file.    ROS:     Review of Systems  Constitutional: Negative.   HENT: Negative.    Eyes: Negative.   Respiratory: Negative.    Gastrointestinal: Negative.   Genitourinary: Negative.    Musculoskeletal: Negative.   Skin: Negative.   Neurological: Negative.   Endo/Heme/Allergies: Negative.   Psychiatric/Behavioral: Negative.    All other systems reviewed and are negative.     All other systems are reviewed and negative.    PHYSICAL EXAM: VS:  BP 120/76   Pulse 61   Ht 6' (1.829 m)   Wt 163 lb 3.2 oz (74 kg)   SpO2 98%   BMI 22.13 kg/m  , BMI Body mass index is 22.13 kg/m. Last weight:  Wt Readings from Last 3 Encounters:  02/28/23 163 lb 3.2 oz (74 kg)  01/06/23 166 lb 3.2 oz (75.4 kg)  11/25/22 165 lb 9.6 oz (75.1 kg)     Physical Exam Vitals reviewed.  Constitutional:      Appearance: Normal appearance. He is normal weight.  HENT:     Head: Normocephalic.     Nose: Nose normal.     Mouth/Throat:     Mouth: Mucous membranes are moist.  Eyes:     Pupils: Pupils are equal, round, and reactive to light.  Cardiovascular:     Rate and Rhythm: Normal rate and regular rhythm.  Pulses: Normal pulses.     Heart sounds: Normal heart sounds.  Pulmonary:     Effort: Pulmonary effort is normal.  Abdominal:     General: Abdomen is flat. Bowel sounds are normal.  Musculoskeletal:        General: Normal range of motion.     Cervical back: Normal range of motion.  Skin:    General: Skin is warm.  Neurological:     General: No focal deficit present.     Mental Status: He is alert.  Psychiatric:        Mood and Affect: Mood normal.       EKG:   Recent Labs: 01/06/2023: ALT 20; BUN 24; Creatinine, Ser 1.82; Hemoglobin 13.4; Platelets 223; Potassium 4.9; Sodium 139    Lipid Panel    Component Value Date/Time   CHOL 124 01/06/2023 0927   TRIG 270 (H) 01/06/2023 0927   HDL 55 01/06/2023 0927   LDLCALC 29 01/06/2023 0927      Other studies Reviewed: Additional studies/ records that were reviewed today include:  Review of the above records demonstrates:       No data to display            ASSESSMENT AND PLAN:    ICD-10-CM   1.  Essential hypertension, benign  I10    stable    2. CHF (congestive heart failure), NYHA class III, acute on chronic, diastolic (HCC)  I50.33    compensated    3. Mixed hyperlipidemia  E78.2     4. Cigarette nicotine dependence without complication  F17.210     5. Nonrheumatic mitral valve regurgitation  I34.0    mild    6. Nonrheumatic aortic valve insufficiency  I35.1    mild to moderate       Problem List Items Addressed This Visit       Cardiovascular and Mediastinum   Essential hypertension, benign - Primary   CHF (congestive heart failure), NYHA class III, acute on chronic, diastolic (HCC)     Other   Mixed hyperlipidemia   Cigarette nicotine dependence without complication   Other Visit Diagnoses     Nonrheumatic mitral valve regurgitation       mild   Nonrheumatic aortic valve insufficiency       mild to moderate          Disposition:   Return in about 3 months (around 05/31/2023).    Total time spent: 30 minutes  Signed,  Adrian Blackwater, MD  02/28/2023 10:01 AM    [facility]

## 2023-04-07 ENCOUNTER — Ambulatory Visit: Payer: Medicaid Other | Admitting: Internal Medicine

## 2023-04-07 ENCOUNTER — Encounter: Payer: Self-pay | Admitting: Internal Medicine

## 2023-04-07 VITALS — BP 116/76 | HR 59 | Ht 72.0 in | Wt 161.4 lb

## 2023-04-07 DIAGNOSIS — I10 Essential (primary) hypertension: Secondary | ICD-10-CM

## 2023-04-07 DIAGNOSIS — I209 Angina pectoris, unspecified: Secondary | ICD-10-CM | POA: Diagnosis not present

## 2023-04-07 DIAGNOSIS — F17210 Nicotine dependence, cigarettes, uncomplicated: Secondary | ICD-10-CM

## 2023-04-07 DIAGNOSIS — I5033 Acute on chronic diastolic (congestive) heart failure: Secondary | ICD-10-CM

## 2023-04-07 DIAGNOSIS — E782 Mixed hyperlipidemia: Secondary | ICD-10-CM

## 2023-04-07 MED ORDER — NITROGLYCERIN 0.4 MG SL SUBL: 0.4000 mg | SUBLINGUAL_TABLET | SUBLINGUAL | 3 refills | Status: AC | PRN

## 2023-04-07 NOTE — Progress Notes (Signed)
Established Patient Office Visit  Subjective:  Patient ID: Henry Gallagher, male    DOB: 1960/11/21  Age: 62 y.o. MRN: [id number]  Chief Complaint  Patient presents with   Follow-up    3 month follow up    Patient comes in for his follow-up today.  He complains of left-sided chest pain that started few days ago and lasted for several minutes, resolved spontaneously.  He has not been coughing or sneezing, there is no fevers no chills.  The pain did not radiate down his left arm, or up his neck and jaw.  He did not complain of any shortness of breath.  But he does have heart disease.  Will schedule a stress test and needs follow-up with the cardiology.    No other concerns at this time.   Past Medical History:  Diagnosis Date   Arthritis    Benign prostatic hypertrophy    CHF (congestive heart failure) (HCC)    Chronic kidney disease    Depression    Hyperlipidemia    Hypertension     Past Surgical History:  Procedure Laterality Date   COLONOSCOPY WITH PROPOFOL N/A 05/18/2015   Procedure: COLONOSCOPY WITH PROPOFOL;  Surgeon: Wallace Cullens, MD;  Location: [facility] ENDOSCOPY;  Service: Gastroenterology;  Laterality: N/A;   TONSILLECTOMY AND ADENOIDECTOMY      Social History   Socioeconomic History   Marital status: Divorced    Spouse name: Not on file   Number of children: Not on file   Years of education: Not on file   Highest education level: Not on file  Occupational History   Not on file  Tobacco Use   Smoking status: Every Day   Smokeless tobacco: Not on file  Substance and Sexual Activity   Alcohol use: Not on file   Drug use: Not on file   Sexual activity: Not on file  Other Topics Concern   Not on file  Social History Narrative   Not on file   Social Drivers of Health   Financial Resource Strain: Not on file  Food Insecurity: Not on file  Transportation Needs: Not on file  Physical Activity: Not on file  Stress: Not on file  Social Connections: Not on file   Intimate Partner Violence: Not on file    History reviewed. No pertinent family history.  No Known Allergies  Outpatient Medications Prior to Visit  Medication Sig   amLODipine (NORVASC) 10 MG tablet Take 10 mg by mouth daily.   aspirin EC 81 MG tablet Take 81 mg by mouth daily.   atorvastatin (LIPITOR) 80 MG tablet Take 80 mg by mouth daily.   carvedilol (COREG) 12.5 MG tablet Take 12.5 mg by mouth 2 (two) times daily with a meal.   chlorthalidone (HYGROTON) 25 MG tablet Take 25 mg by mouth daily.   hydrALAZINE (APRESOLINE) 50 MG tablet Take 50 mg by mouth 3 (three) times daily.   lisinopril (ZESTRIL) 40 MG tablet Take 40 mg by mouth daily.   No facility-administered medications prior to visit.    Review of Systems  Constitutional: Negative.  Negative for chills, fever and weight loss.  HENT: Negative.    Eyes: Negative.   Respiratory: Negative.  Negative for cough and shortness of breath.   Cardiovascular: Negative.  Negative for chest pain, palpitations and leg swelling.  Gastrointestinal: Negative.  Negative for abdominal pain, constipation, diarrhea, heartburn, nausea and vomiting.  Genitourinary: Negative.  Negative for dysuria and flank pain.  Musculoskeletal:  Negative.  Negative for joint pain and myalgias.  Skin: Negative.   Neurological: Negative.  Negative for dizziness and headaches.  Endo/Heme/Allergies: Negative.   Psychiatric/Behavioral: Negative.  Negative for depression and suicidal ideas. The patient is not nervous/anxious.        Objective:   BP 116/76   Pulse (!) 59   Ht 6' (1.829 m)   Wt 161 lb 6.4 oz (73.2 kg)   SpO2 95%   BMI 21.89 kg/m   Vitals:   04/07/23 0946  BP: 116/76  Pulse: (!) 59  Height: 6' (1.829 m)  Weight: 161 lb 6.4 oz (73.2 kg)  SpO2: 95%  BMI (Calculated): 21.89    Physical Exam Vitals and nursing note reviewed.  Constitutional:      Appearance: Normal appearance.  HENT:     Head: Normocephalic and atraumatic.      Nose: Nose normal.     Mouth/Throat:     Mouth: Mucous membranes are moist.     Pharynx: Oropharynx is clear.  Eyes:     Conjunctiva/sclera: Conjunctivae normal.     Pupils: Pupils are equal, round, and reactive to light.  Cardiovascular:     Rate and Rhythm: Normal rate and regular rhythm.     Pulses: Normal pulses.     Heart sounds: Normal heart sounds.  Pulmonary:     Effort: Pulmonary effort is normal.     Breath sounds: Normal breath sounds.  Abdominal:     General: Bowel sounds are normal.     Palpations: Abdomen is soft. There is no mass.     Tenderness: There is no abdominal tenderness.     Hernia: No hernia is present.  Musculoskeletal:        General: Normal range of motion.     Cervical back: Normal range of motion.  Skin:    General: Skin is warm and dry.  Neurological:     General: No focal deficit present.     Mental Status: He is alert and oriented to person, place, and time.  Psychiatric:        Mood and Affect: Mood normal.        Behavior: Behavior normal.        Judgment: Judgment normal.      No results found for any visits on 04/07/23.  No results found for this or any previous visit (from the past 2160 hours).    Assessment & Plan:  Patient will return for fasting labs.  Schedule stress test.  Cardiology follow-up.  Will prescribe nitroglycerin sublingual to be taken as needed. Problem List Items Addressed This Visit     Essential hypertension, benign   Relevant Medications   nitroGLYCERIN (NITROSTAT) 0.4 MG SL tablet   Other Relevant Orders   CMP14+EGFR   CHF (congestive heart failure), NYHA class III, acute on chronic, diastolic (HCC)   Relevant Medications   nitroGLYCERIN (NITROSTAT) 0.4 MG SL tablet   Other Relevant Orders   CMP14+EGFR   CBC with Diff   Mixed hyperlipidemia   Relevant Medications   nitroGLYCERIN (NITROSTAT) 0.4 MG SL tablet   Other Relevant Orders   Lipid Panel w/o Chol/HDL Ratio   Cigarette nicotine dependence  without complication   Other Visit Diagnoses       Angina pectoris (HCC)    -  Primary   Relevant Medications   nitroGLYCERIN (NITROSTAT) 0.4 MG SL tablet   Other Relevant Orders   Myocardial Perfusion Imaging       Return  in about 1 month (around 05/08/2023).   Total time spent: 30 minutes  [name], MD  04/07/2023   This document may have been prepared by [facility] Voice Recognition software and as such may include unintentional dictation errors.

## 2023-04-13 ENCOUNTER — Ambulatory Visit: Payer: Medicaid Other | Admitting: Cardiovascular Disease

## 2023-04-20 ENCOUNTER — Ambulatory Visit: Payer: Medicaid Other | Admitting: Cardiovascular Disease

## 2023-05-08 ENCOUNTER — Ambulatory Visit: Payer: Medicaid Other | Admitting: Internal Medicine

## 2023-05-15 ENCOUNTER — Ambulatory Visit (INDEPENDENT_AMBULATORY_CARE_PROVIDER_SITE_OTHER): Payer: Medicaid Other | Admitting: Cardiovascular Disease

## 2023-05-15 ENCOUNTER — Encounter: Payer: Self-pay | Admitting: Cardiovascular Disease

## 2023-05-15 ENCOUNTER — Encounter: Payer: Self-pay | Admitting: Internal Medicine

## 2023-05-15 ENCOUNTER — Ambulatory Visit: Payer: Medicaid Other | Admitting: Internal Medicine

## 2023-05-15 VITALS — BP 122/70 | HR 69 | Ht 72.0 in | Wt 162.0 lb

## 2023-05-15 VITALS — BP 122/70 | HR 69 | Ht 72.0 in | Wt 165.2 lb

## 2023-05-15 DIAGNOSIS — I5033 Acute on chronic diastolic (congestive) heart failure: Secondary | ICD-10-CM | POA: Diagnosis not present

## 2023-05-15 DIAGNOSIS — E782 Mixed hyperlipidemia: Secondary | ICD-10-CM

## 2023-05-15 DIAGNOSIS — I340 Nonrheumatic mitral (valve) insufficiency: Secondary | ICD-10-CM

## 2023-05-15 DIAGNOSIS — I351 Nonrheumatic aortic (valve) insufficiency: Secondary | ICD-10-CM

## 2023-05-15 DIAGNOSIS — I209 Angina pectoris, unspecified: Secondary | ICD-10-CM

## 2023-05-15 DIAGNOSIS — I10 Essential (primary) hypertension: Secondary | ICD-10-CM | POA: Diagnosis not present

## 2023-05-15 MED ORDER — GABAPENTIN 100 MG PO CAPS: 100.0000 mg | ORAL_CAPSULE | Freq: Two times a day (BID) | ORAL | 2 refills | Status: AC

## 2023-05-15 NOTE — Progress Notes (Signed)
Established Patient Office Visit  Subjective:  Patient ID: Henry Gallagher, male    DOB: 11/24/1960  Age: 63 y.o. MRN: [id number]  Chief Complaint  Patient presents with   Follow-up    1 month follow up    Patient comes in for his follow-up of angina.  Previously he had reported of left-sided chest pain, and was prescribed sublingual nitroglycerin and a cardiology evaluation.  He was not able to get transportation at that time.  Today he he says he is feeling better however still needs to be evaluated by cardiology.  His EKG done today shows multiple PVCs and ST-T changes suggestive of anterior ischemia.  Will request cardiology consult for today and reschedule nuclear stress test. Recently quit smoking.  Denies chest congestion or cough.  No fevers or chills.    No other concerns at this time.   Past Medical History:  Diagnosis Date   Arthritis    Benign prostatic hypertrophy    CHF (congestive heart failure) (HCC)    Chronic kidney disease    Depression    Hyperlipidemia    Hypertension     Past Surgical History:  Procedure Laterality Date   COLONOSCOPY WITH PROPOFOL N/A 05/18/2015   Procedure: COLONOSCOPY WITH PROPOFOL;  Surgeon: Wallace Cullens, MD;  Location: [facility] ENDOSCOPY;  Service: Gastroenterology;  Laterality: N/A;   TONSILLECTOMY AND ADENOIDECTOMY      Social History   Socioeconomic History   Marital status: Divorced    Spouse name: Not on file   Number of children: Not on file   Years of education: Not on file   Highest education level: Not on file  Occupational History   Not on file  Tobacco Use   Smoking status: Every Day   Smokeless tobacco: Not on file  Substance and Sexual Activity   Alcohol use: Not on file   Drug use: Not on file   Sexual activity: Not on file  Other Topics Concern   Not on file  Social History Narrative   Not on file   Social Drivers of Health   Financial Resource Strain: Not on file  Food Insecurity: Not on file   Transportation Needs: Not on file  Physical Activity: Not on file  Stress: Not on file  Social Connections: Not on file  Intimate Partner Violence: Not on file    No family history on file.  No Known Allergies  Outpatient Medications Prior to Visit  Medication Sig   amLODipine (NORVASC) 10 MG tablet Take 10 mg by mouth daily.   aspirin EC 81 MG tablet Take 81 mg by mouth daily.   atorvastatin (LIPITOR) 80 MG tablet Take 80 mg by mouth daily.   carvedilol (COREG) 12.5 MG tablet Take 12.5 mg by mouth 2 (two) times daily with a meal.   chlorthalidone (HYGROTON) 25 MG tablet Take 25 mg by mouth daily.   hydrALAZINE (APRESOLINE) 50 MG tablet Take 50 mg by mouth 3 (three) times daily.   lisinopril (ZESTRIL) 40 MG tablet Take 40 mg by mouth daily.   nitroGLYCERIN (NITROSTAT) 0.4 MG SL tablet Place 1 tablet (0.4 mg total) under the tongue every 5 (five) minutes as needed for chest pain. (Patient not taking: Reported on 05/15/2023)   No facility-administered medications prior to visit.    Review of Systems  Constitutional: Negative.  Negative for chills, fever, malaise/fatigue and weight loss.  HENT:  Positive for sore throat. Negative for congestion and sinus pain.   Eyes: Negative.  Respiratory: Negative.  Negative for cough and shortness of breath.   Cardiovascular:  Positive for chest pain. Negative for palpitations and leg swelling.  Gastrointestinal: Negative.  Negative for abdominal pain, blood in stool, constipation, diarrhea, heartburn, melena, nausea and vomiting.  Genitourinary: Negative.  Negative for dysuria and flank pain.  Musculoskeletal: Negative.  Negative for joint pain and myalgias.  Skin: Negative.   Neurological: Negative.  Negative for dizziness and headaches.  Endo/Heme/Allergies: Negative.   Psychiatric/Behavioral: Negative.  Negative for depression and suicidal ideas. The patient is not nervous/anxious.        Objective:   BP 122/70   Pulse 69   Ht 6'  (1.829 m)   Wt 165 lb 3.2 oz (74.9 kg)   SpO2 99%   BMI 22.41 kg/m   Vitals:   05/15/23 0908  BP: 122/70  Pulse: 69  Height: 6' (1.829 m)  Weight: 165 lb 3.2 oz (74.9 kg)  SpO2: 99%  BMI (Calculated): 22.4    Physical Exam Vitals and nursing note reviewed.  Constitutional:      Appearance: Normal appearance.  HENT:     Head: Normocephalic and atraumatic.     Nose: Nose normal.     Mouth/Throat:     Mouth: Mucous membranes are moist.     Pharynx: Oropharynx is clear.  Eyes:     Conjunctiva/sclera: Conjunctivae normal.     Pupils: Pupils are equal, round, and reactive to light.  Cardiovascular:     Rate and Rhythm: Normal rate and regular rhythm.     Pulses: Normal pulses.     Heart sounds: Normal heart sounds.  Pulmonary:     Effort: Pulmonary effort is normal.     Breath sounds: Normal breath sounds.  Abdominal:     General: Bowel sounds are normal.     Palpations: Abdomen is soft.  Musculoskeletal:        General: Normal range of motion.     Cervical back: Normal range of motion.  Skin:    General: Skin is warm and dry.  Neurological:     General: No focal deficit present.     Mental Status: He is alert and oriented to person, place, and time.  Psychiatric:        Mood and Affect: Mood normal.        Behavior: Behavior normal.        Judgment: Judgment normal.      No results found for any visits on 05/15/23.  No results found for this or any previous visit (from the past 2160 hours).    Assessment & Plan:  Cardiology consult.  Labs today.  Continue all medications. Problem List Items Addressed This Visit     Essential hypertension, benign   Relevant Orders   CBC with Diff   CMP14+EGFR   CHF (congestive heart failure), NYHA class III, acute on chronic, diastolic (HCC)   Mixed hyperlipidemia   Relevant Orders   Lipid Panel w/o Chol/HDL Ratio   Other Visit Diagnoses       Angina pectoris (HCC)    -  Primary   Relevant Orders   CBC with Diff    EKG 12-Lead       Return in about 1 month (around 06/12/2023).   Total time spent: 30 minutes  [name], MD  05/15/2023   This document may have been prepared by [facility] Voice Recognition software and as such may include unintentional dictation errors.

## 2023-05-15 NOTE — Progress Notes (Addendum)
Cardiology Office Note   Date:  05/15/2023   ID:  Henry Gallagher, DOB [birth year], MRN [id number]  PCP:  [name], MD  Cardiologist:  Adrian Blackwater, MD      History of Present Illness: Henry Gallagher is a 63 y.o. male who presents for  Chief Complaint  Patient presents with   Acute Visit    Chest Pain, ABN EKG    Doing well, quit smoking      Past Medical History:  Diagnosis Date   Arthritis    Benign prostatic hypertrophy    CHF (congestive heart failure) (HCC)    Chronic kidney disease    Depression    Hyperlipidemia    Hypertension      Past Surgical History:  Procedure Laterality Date   COLONOSCOPY WITH PROPOFOL N/A 05/18/2015   Procedure: COLONOSCOPY WITH PROPOFOL;  Surgeon: Wallace Cullens, MD;  Location: [facility] ENDOSCOPY;  Service: Gastroenterology;  Laterality: N/A;   TONSILLECTOMY AND ADENOIDECTOMY       Current Outpatient Medications  Medication Sig Dispense Refill   amLODipine (NORVASC) 10 MG tablet Take 10 mg by mouth daily.     aspirin EC 81 MG tablet Take 81 mg by mouth daily.     atorvastatin (LIPITOR) 80 MG tablet Take 80 mg by mouth daily.     carvedilol (COREG) 12.5 MG tablet Take 12.5 mg by mouth 2 (two) times daily with a meal.     chlorthalidone (HYGROTON) 25 MG tablet Take 25 mg by mouth daily.     gabapentin (NEURONTIN) 100 MG capsule Take 1 capsule (100 mg total) by mouth 2 (two) times daily. 60 capsule 2   hydrALAZINE (APRESOLINE) 50 MG tablet Take 50 mg by mouth 3 (three) times daily.     lisinopril (ZESTRIL) 40 MG tablet Take 40 mg by mouth daily.     nitroGLYCERIN (NITROSTAT) 0.4 MG SL tablet Place 1 tablet (0.4 mg total) under the tongue every 5 (five) minutes as needed for chest pain. (Patient not taking: Reported on 05/15/2023) 100 tablet 3   No current facility-administered medications for this visit.    Allergies:   Patient has no known allergies.    Social History:   reports that he has been smoking. He does not have any  smokeless tobacco history on file. No history on file for alcohol use and drug use.   Family History:  family history is not on file.    ROS:     Review of Systems  Constitutional: Negative.   HENT: Negative.    Eyes: Negative.   Respiratory: Negative.    Gastrointestinal: Negative.   Genitourinary: Negative.   Musculoskeletal: Negative.   Skin: Negative.   Neurological: Negative.   Endo/Heme/Allergies: Negative.   Psychiatric/Behavioral: Negative.    All other systems reviewed and are negative.     All other systems are reviewed and negative.    PHYSICAL EXAM: VS:  BP 122/70   Pulse 69   Ht 6' (1.829 m)   Wt 162 lb (73.5 kg)   SpO2 99%   BMI 21.97 kg/m  , BMI Body mass index is 21.97 kg/m. Last weight:  Wt Readings from Last 3 Encounters:  05/15/23 162 lb (73.5 kg)  05/15/23 165 lb 3.2 oz (74.9 kg)  04/07/23 161 lb 6.4 oz (73.2 kg)     Physical Exam Vitals reviewed.  Constitutional:      Appearance: Normal appearance. He is normal weight.  HENT:  Head: Normocephalic.     Nose: Nose normal.     Mouth/Throat:     Mouth: Mucous membranes are moist.  Eyes:     Pupils: Pupils are equal, round, and reactive to light.  Cardiovascular:     Rate and Rhythm: Normal rate and regular rhythm.     Pulses: Normal pulses.     Heart sounds: Normal heart sounds.  Pulmonary:     Effort: Pulmonary effort is normal.  Abdominal:     General: Abdomen is flat. Bowel sounds are normal.  Musculoskeletal:        General: Normal range of motion.     Cervical back: Normal range of motion.  Skin:    General: Skin is warm.  Neurological:     General: No focal deficit present.     Mental Status: He is alert.  Psychiatric:        Mood and Affect: Mood normal.       EKG:   Recent Labs: 01/06/2023: ALT 20; BUN 24; Creatinine, Ser 1.82; Hemoglobin 13.4; Platelets 223; Potassium 4.9; Sodium 139    Lipid Panel    Component Value Date/Time   CHOL 124 01/06/2023 0927    TRIG 270 (H) 01/06/2023 0927   HDL 55 01/06/2023 0927   LDLCALC 29 01/06/2023 0927      Other studies Reviewed: Additional studies/ records that were reviewed today include:  Review of the above records demonstrates:       No data to display            ASSESSMENT AND PLAN:    ICD-10-CM   1. Angina pectoris (HCC)  I20.9 MYOCARDIAL PERFUSION IMAGING   Reschedual stress test as coud not get transportation. Patient did not have previous approved stress test.  Thus will schedual prior approved stress test.    2. Essential hypertension, benign  I10 MYOCARDIAL PERFUSION IMAGING    3. CHF (congestive heart failure), NYHA class III, acute on chronic, diastolic (HCC)  I50.33 MYOCARDIAL PERFUSION IMAGING    4. Mixed hyperlipidemia  E78.2 MYOCARDIAL PERFUSION IMAGING    5. Nonrheumatic mitral valve regurgitation  I34.0 MYOCARDIAL PERFUSION IMAGING    6. Nonrheumatic aortic valve insufficiency  I35.1 MYOCARDIAL PERFUSION IMAGING       Problem List Items Addressed This Visit       Cardiovascular and Mediastinum   Essential hypertension, benign   Relevant Orders   MYOCARDIAL PERFUSION IMAGING   CHF (congestive heart failure), NYHA class III, acute on chronic, diastolic (HCC)   Relevant Orders   MYOCARDIAL PERFUSION IMAGING     Other   Mixed hyperlipidemia   Relevant Orders   MYOCARDIAL PERFUSION IMAGING   Other Visit Diagnoses       Angina pectoris (HCC)    -  Primary   Reschedual stress test as coud not get transportation. Patient did not have previous approved stress test.  Thus will schedual prior approved stress test.   Relevant Orders   MYOCARDIAL PERFUSION IMAGING     Nonrheumatic mitral valve regurgitation       Relevant Orders   MYOCARDIAL PERFUSION IMAGING     Nonrheumatic aortic valve insufficiency       Relevant Orders   MYOCARDIAL PERFUSION IMAGING          Disposition:   Return in about 4 weeks (around 06/12/2023) for stress test and f/u.    Total  time spent: 30 minutes  Signed,  Adrian Blackwater, MD  05/15/2023 1:37 PM  [facility]

## 2023-05-16 ENCOUNTER — Other Ambulatory Visit: Payer: Self-pay | Admitting: Internal Medicine

## 2023-05-16 DIAGNOSIS — E782 Mixed hyperlipidemia: Secondary | ICD-10-CM

## 2023-05-16 LAB — LIPID PANEL W/O CHOL/HDL RATIO
Cholesterol, Total: 139 mg/dL (ref 100–199)
HDL: 54 mg/dL (ref >39)
LDL Chol Calc (NIH): 30 mg/dL (ref 0–99)
Triglycerides: 387 mg/dL — ABNORMAL HIGH (ref 0–149)
VLDL Cholesterol Cal: 55 mg/dL — ABNORMAL HIGH (ref 5–40)

## 2023-05-16 LAB — CBC WITH DIFFERENTIAL/PLATELET
Basophils Absolute: 0.1 10*3/uL (ref 0.0–0.2)
Basos: 1 %
EOS (ABSOLUTE): 0.2 10*3/uL (ref 0.0–0.4)
Eos: 5 %
Hematocrit: 41.7 % (ref 37.5–51.0)
Hemoglobin: 14.2 g/dL (ref 13.0–17.7)
Immature Grans (Abs): 0 10*3/uL (ref 0.0–0.1)
Immature Granulocytes: 0 %
Lymphocytes Absolute: 2.2 10*3/uL (ref 0.7–3.1)
Lymphs: 41 %
MCH: 34.9 pg — ABNORMAL HIGH (ref 26.6–33.0)
MCHC: 34.1 g/dL (ref 31.5–35.7)
MCV: 103 fL — ABNORMAL HIGH (ref 79–97)
Monocytes Absolute: 0.5 10*3/uL (ref 0.1–0.9)
Monocytes: 10 %
Neutrophils Absolute: 2.3 10*3/uL (ref 1.4–7.0)
Neutrophils: 43 %
Platelets: 279 10*3/uL (ref 150–450)
RBC: 4.07 x10E6/uL — ABNORMAL LOW (ref 4.14–5.80)
RDW: 13.7 % (ref 11.6–15.4)
WBC: 5.3 10*3/uL (ref 3.4–10.8)

## 2023-05-16 LAB — CMP14+EGFR
ALT: 22 [IU]/L (ref 0–44)
AST: 37 [IU]/L (ref 0–40)
Albumin: 4.2 g/dL (ref 3.9–4.9)
Alkaline Phosphatase: 91 [IU]/L (ref 44–121)
BUN/Creatinine Ratio: 12 (ref 10–24)
BUN: 20 mg/dL (ref 8–27)
Bilirubin Total: 0.4 mg/dL (ref 0.0–1.2)
CO2: 21 mmol/L (ref 20–29)
Calcium: 9.5 mg/dL (ref 8.6–10.2)
Chloride: 104 mmol/L (ref 96–106)
Creatinine, Ser: 1.67 mg/dL — ABNORMAL HIGH (ref 0.76–1.27)
Globulin, Total: 2.9 g/dL (ref 1.5–4.5)
Glucose: 84 mg/dL (ref 70–99)
Potassium: 4.8 mmol/L (ref 3.5–5.2)
Sodium: 141 mmol/L (ref 134–144)
Total Protein: 7.1 g/dL (ref 6.0–8.5)
eGFR: 45 mL/min/{1.73_m2} — ABNORMAL LOW (ref >59)

## 2023-05-16 MED ORDER — FENOFIBRATE 48 MG PO TABS: 48.0000 mg | ORAL_TABLET | Freq: Every day | ORAL | 11 refills | Status: DC

## 2023-05-16 NOTE — Progress Notes (Signed)
 Patient notified

## 2023-05-17 ENCOUNTER — Telehealth: Payer: Self-pay | Admitting: Internal Medicine

## 2023-05-17 ENCOUNTER — Other Ambulatory Visit: Payer: Self-pay

## 2023-05-17 DIAGNOSIS — E782 Mixed hyperlipidemia: Secondary | ICD-10-CM

## 2023-05-17 MED ORDER — FENOFIBRATE 48 MG PO TABS: 48.0000 mg | ORAL_TABLET | Freq: Every day | ORAL | 3 refills | Status: DC

## 2023-05-17 NOTE — Telephone Encounter (Signed)
 Patient left VM that he needs his new Rx sent to [facility] [place] via fax. Their fax # is [phone].

## 2023-05-18 ENCOUNTER — Other Ambulatory Visit: Payer: Self-pay | Admitting: Internal Medicine

## 2023-05-18 DIAGNOSIS — E782 Mixed hyperlipidemia: Secondary | ICD-10-CM

## 2023-05-18 MED ORDER — FENOFIBRATE 48 MG PO TABS: 48.0000 mg | ORAL_TABLET | Freq: Every day | ORAL | 3 refills | Status: DC

## 2023-05-25 ENCOUNTER — Ambulatory Visit: Payer: Medicaid Other | Admitting: Cardiovascular Disease

## 2023-05-25 ENCOUNTER — Encounter: Payer: Self-pay | Admitting: Cardiovascular Disease

## 2023-05-25 VITALS — BP 112/70 | HR 63 | Ht 72.0 in | Wt 159.0 lb

## 2023-05-25 DIAGNOSIS — I351 Nonrheumatic aortic (valve) insufficiency: Secondary | ICD-10-CM

## 2023-05-25 DIAGNOSIS — I5033 Acute on chronic diastolic (congestive) heart failure: Secondary | ICD-10-CM

## 2023-05-25 DIAGNOSIS — I10 Essential (primary) hypertension: Secondary | ICD-10-CM

## 2023-05-25 DIAGNOSIS — E782 Mixed hyperlipidemia: Secondary | ICD-10-CM | POA: Diagnosis not present

## 2023-05-25 DIAGNOSIS — F17210 Nicotine dependence, cigarettes, uncomplicated: Secondary | ICD-10-CM

## 2023-05-25 NOTE — Progress Notes (Signed)
Cardiology Office Note   Date:  05/25/2023   ID:  Henry Gallagher, DOB 1961-04-09, MRN [id number]  PCP:  [name], MD  Cardiologist:  Adrian Blackwater, MD      History of Present Illness: Henry Gallagher is a 63 y.o. male who presents for  Chief Complaint  Patient presents with   Follow-up    3 month follow up     Doing well      Past Medical History:  Diagnosis Date   Arthritis    Benign prostatic hypertrophy    CHF (congestive heart failure) (HCC)    Chronic kidney disease    Depression    Hyperlipidemia    Hypertension      Past Surgical History:  Procedure Laterality Date   COLONOSCOPY WITH PROPOFOL N/A 05/18/2015   Procedure: COLONOSCOPY WITH PROPOFOL;  Surgeon: Wallace Cullens, MD;  Location: [facility] ENDOSCOPY;  Service: Gastroenterology;  Laterality: N/A;   TONSILLECTOMY AND ADENOIDECTOMY       Current Outpatient Medications  Medication Sig Dispense Refill   amLODipine (NORVASC) 10 MG tablet Take 10 mg by mouth daily.     aspirin EC 81 MG tablet Take 81 mg by mouth daily.     atorvastatin (LIPITOR) 80 MG tablet Take 80 mg by mouth daily.     carvedilol (COREG) 12.5 MG tablet Take 12.5 mg by mouth 2 (two) times daily with a meal.     chlorthalidone (HYGROTON) 25 MG tablet Take 25 mg by mouth daily.     fenofibrate (TRICOR) 48 MG tablet Take 1 tablet (48 mg total) by mouth daily. 90 tablet 3   gabapentin (NEURONTIN) 100 MG capsule Take 1 capsule (100 mg total) by mouth 2 (two) times daily. 60 capsule 2   hydrALAZINE (APRESOLINE) 50 MG tablet Take 50 mg by mouth 3 (three) times daily.     lisinopril (ZESTRIL) 40 MG tablet Take 40 mg by mouth daily.     nitroGLYCERIN (NITROSTAT) 0.4 MG SL tablet Place 1 tablet (0.4 mg total) under the tongue every 5 (five) minutes as needed for chest pain. (Patient not taking: Reported on 05/15/2023) 100 tablet 3   No current facility-administered medications for this visit.    Allergies:   Patient has no known allergies.     Social History:   reports that he has been smoking. He does not have any smokeless tobacco history on file. No history on file for alcohol use and drug use.   Family History:  family history is not on file.    ROS:     Review of Systems  Constitutional: Negative.   HENT: Negative.    Eyes: Negative.   Respiratory: Negative.    Gastrointestinal: Negative.   Genitourinary: Negative.   Musculoskeletal: Negative.   Skin: Negative.   Neurological: Negative.   Endo/Heme/Allergies: Negative.   Psychiatric/Behavioral: Negative.    All other systems reviewed and are negative.     All other systems are reviewed and negative.    PHYSICAL EXAM: VS:  BP 112/70   Pulse 63   Ht 6' (1.829 m)   Wt 159 lb (72.1 kg)   SpO2 98%   BMI 21.56 kg/m  , BMI Body mass index is 21.56 kg/m. Last weight:  Wt Readings from Last 3 Encounters:  05/25/23 159 lb (72.1 kg)  05/15/23 162 lb (73.5 kg)  05/15/23 165 lb 3.2 oz (74.9 kg)     Physical Exam Vitals reviewed.  Constitutional:  Appearance: Normal appearance. He is normal weight.  HENT:     Head: Normocephalic.     Nose: Nose normal.     Mouth/Throat:     Mouth: Mucous membranes are moist.  Eyes:     Pupils: Pupils are equal, round, and reactive to light.  Cardiovascular:     Rate and Rhythm: Normal rate and regular rhythm.     Pulses: Normal pulses.     Heart sounds: Normal heart sounds.  Pulmonary:     Effort: Pulmonary effort is normal.  Abdominal:     General: Abdomen is flat. Bowel sounds are normal.  Musculoskeletal:        General: Normal range of motion.     Cervical back: Normal range of motion.  Skin:    General: Skin is warm.  Neurological:     General: No focal deficit present.     Mental Status: He is alert.  Psychiatric:        Mood and Affect: Mood normal.       EKG:   Recent Labs: 05/15/2023: ALT 22; BUN 20; Creatinine, Ser 1.67; Hemoglobin 14.2; Platelets 279; Potassium 4.8; Sodium 141     Lipid Panel    Component Value Date/Time   CHOL 139 05/15/2023 0952   TRIG 387 (H) 05/15/2023 0952   HDL 54 05/15/2023 0952   LDLCALC 30 05/15/2023 5784      Other studies Reviewed: Additional studies/ records that were reviewed today include:  Review of the above records demonstrates:       No data to display            ASSESSMENT AND PLAN:    ICD-10-CM   1. Essential hypertension, benign  I10     2. CHF (congestive heart failure), NYHA class III, acute on chronic, diastolic (HCC)  I50.33    Awaiting stress test next week    3. Mixed hyperlipidemia  E78.2     4. Cigarette nicotine dependence without complication  F17.210    quit smoking    5. Nonrheumatic aortic valve insufficiency  I35.1        Problem List Items Addressed This Visit       Cardiovascular and Mediastinum   Essential hypertension, benign - Primary   CHF (congestive heart failure), NYHA class III, acute on chronic, diastolic (HCC)     Other   Mixed hyperlipidemia   Cigarette nicotine dependence without complication   Other Visit Diagnoses       Nonrheumatic aortic valve insufficiency              Disposition:   Return in about 5 weeks (around 06/29/2023).    Total time spent: 30 minutes  Signed,  Adrian Blackwater, MD  05/25/2023 9:33 AM    [facility]

## 2023-06-01 ENCOUNTER — Ambulatory Visit: Payer: Medicaid Other | Admitting: Cardiovascular Disease

## 2023-06-08 ENCOUNTER — Ambulatory Visit: Payer: Medicaid Other | Admitting: Cardiovascular Disease

## 2023-06-15 ENCOUNTER — Ambulatory Visit: Payer: Medicaid Other | Admitting: Internal Medicine

## 2023-06-15 ENCOUNTER — Encounter: Payer: Self-pay | Admitting: Internal Medicine

## 2023-06-15 VITALS — BP 112/72 | HR 74 | Ht 72.0 in | Wt 162.0 lb

## 2023-06-15 DIAGNOSIS — I209 Angina pectoris, unspecified: Secondary | ICD-10-CM

## 2023-06-15 DIAGNOSIS — E782 Mixed hyperlipidemia: Secondary | ICD-10-CM | POA: Diagnosis not present

## 2023-06-15 DIAGNOSIS — F17210 Nicotine dependence, cigarettes, uncomplicated: Secondary | ICD-10-CM | POA: Diagnosis not present

## 2023-06-15 DIAGNOSIS — I10 Essential (primary) hypertension: Secondary | ICD-10-CM | POA: Diagnosis not present

## 2023-06-15 NOTE — Progress Notes (Signed)
 Established Patient Office Visit  Subjective:  Patient ID: Henry Gallagher, male    DOB: 01-30-1961  Age: 63 y.o. MRN: [id number]  Chief Complaint  Patient presents with   Follow-up    1 month follow up    Patient comes in for his follow-up today.  He has complaints of intermittent angina and is scheduled for a stress test.  He has a prescription for sublingual nitroglycerin to be taken as needed, but says he has not taken it for a few days.  Recent labs showed elevated triglycerides, and Tricor 48 mg prescription was sent to his pharmacy but he has not picked it up yet.  Will get it today.  No complaints of shortness of breath, no headaches, no dizziness.    No other concerns at this time.   Past Medical History:  Diagnosis Date   Arthritis    Benign prostatic hypertrophy    CHF (congestive heart failure) (HCC)    Chronic kidney disease    Depression    Hyperlipidemia    Hypertension     Past Surgical History:  Procedure Laterality Date   COLONOSCOPY WITH PROPOFOL N/A 05/18/2015   Procedure: COLONOSCOPY WITH PROPOFOL;  Surgeon: Wallace Cullens, MD;  Location: [facility] ENDOSCOPY;  Service: Gastroenterology;  Laterality: N/A;   TONSILLECTOMY AND ADENOIDECTOMY      Social History   Socioeconomic History   Marital status: Divorced    Spouse name: Not on file   Number of children: Not on file   Years of education: Not on file   Highest education level: Not on file  Occupational History   Not on file  Tobacco Use   Smoking status: Every Day   Smokeless tobacco: Not on file  Substance and Sexual Activity   Alcohol use: Not on file   Drug use: Not on file   Sexual activity: Not on file  Other Topics Concern   Not on file  Social History Narrative   Not on file   Social Drivers of Health   Financial Resource Strain: Not on file  Food Insecurity: Not on file  Transportation Needs: Not on file  Physical Activity: Not on file  Stress: Not on file  Social Connections: Not on  file  Intimate Partner Violence: Not on file    History reviewed. No pertinent family history.  No Known Allergies  Outpatient Medications Prior to Visit  Medication Sig   amLODipine (NORVASC) 10 MG tablet Take 10 mg by mouth daily.   aspirin EC 81 MG tablet Take 81 mg by mouth daily.   atorvastatin (LIPITOR) 80 MG tablet Take 80 mg by mouth daily.   carvedilol (COREG) 12.5 MG tablet Take 12.5 mg by mouth 2 (two) times daily with a meal.   chlorthalidone (HYGROTON) 25 MG tablet Take 25 mg by mouth daily.   fenofibrate (TRICOR) 48 MG tablet Take 1 tablet (48 mg total) by mouth daily.   gabapentin (NEURONTIN) 100 MG capsule Take 1 capsule (100 mg total) by mouth 2 (two) times daily.   hydrALAZINE (APRESOLINE) 50 MG tablet Take 50 mg by mouth 3 (three) times daily.   lisinopril (ZESTRIL) 40 MG tablet Take 40 mg by mouth daily.   nitroGLYCERIN (NITROSTAT) 0.4 MG SL tablet Place 1 tablet (0.4 mg total) under the tongue every 5 (five) minutes as needed for chest pain. (Patient not taking: Reported on 05/15/2023)   No facility-administered medications prior to visit.    Review of Systems  Constitutional: Negative.  Negative for chills, fever and weight loss.  HENT: Negative.  Negative for congestion and sore throat.   Eyes: Negative.   Respiratory: Negative.  Negative for cough and shortness of breath.   Cardiovascular: Negative.  Negative for chest pain, palpitations and leg swelling.  Gastrointestinal: Negative.  Negative for abdominal pain, constipation, diarrhea, heartburn, nausea and vomiting.  Genitourinary: Negative.  Negative for dysuria and flank pain.  Musculoskeletal: Negative.  Negative for joint pain and myalgias.  Skin: Negative.   Neurological: Negative.  Negative for dizziness, tingling and headaches.  Endo/Heme/Allergies: Negative.   Psychiatric/Behavioral: Negative.  Negative for depression and suicidal ideas. The patient is not nervous/anxious.        Objective:    BP 112/72   Pulse 74   Ht 6' (1.829 m)   Wt 162 lb (73.5 kg)   SpO2 99%   BMI 21.97 kg/m   Vitals:   06/15/23 0923  BP: 112/72  Pulse: 74  Height: 6' (1.829 m)  Weight: 162 lb (73.5 kg)  SpO2: 99%  BMI (Calculated): 21.97    Physical Exam Vitals and nursing note reviewed.  Constitutional:      Appearance: Normal appearance.  HENT:     Head: Normocephalic and atraumatic.     Nose: Nose normal.     Mouth/Throat:     Mouth: Mucous membranes are moist.     Pharynx: Oropharynx is clear.  Eyes:     Conjunctiva/sclera: Conjunctivae normal.     Pupils: Pupils are equal, round, and reactive to light.  Cardiovascular:     Rate and Rhythm: Normal rate and regular rhythm.     Pulses: Normal pulses.     Heart sounds: Normal heart sounds.  Pulmonary:     Effort: Pulmonary effort is normal.     Breath sounds: Normal breath sounds.  Abdominal:     General: Bowel sounds are normal.     Palpations: Abdomen is soft.  Musculoskeletal:        General: Normal range of motion.     Cervical back: Normal range of motion.  Skin:    General: Skin is warm and dry.  Neurological:     General: No focal deficit present.     Mental Status: He is alert and oriented to person, place, and time.  Psychiatric:        Mood and Affect: Mood normal.        Behavior: Behavior normal.        Judgment: Judgment normal.      No results found for any visits on 06/15/23.  Recent Results (from the past 2160 hours)  CBC with Diff     Status: Abnormal   Collection Time: 05/15/23  9:52 AM  Result Value Ref Range   WBC 5.3 3.4 - 10.8 x10E3/uL   RBC 4.07 (L) 4.14 - 5.80 x10E6/uL   Hemoglobin 14.2 13.0 - 17.7 g/dL   Hematocrit 16.1 09.6 - 51.0 %   MCV 103 (H) 79 - 97 fL   MCH 34.9 (H) 26.6 - 33.0 pg   MCHC 34.1 31.5 - 35.7 g/dL   RDW 04.5 40.9 - 81.1 %   Platelets 279 150 - 450 x10E3/uL   Neutrophils 43 Not Estab. %   Lymphs 41 Not Estab. %   Monocytes 10 Not Estab. %   Eos 5 Not Estab. %    Basos 1 Not Estab. %   Neutrophils Absolute 2.3 1.4 - 7.0 x10E3/uL   Lymphocytes Absolute 2.2 0.7 -  3.1 x10E3/uL   Monocytes Absolute 0.5 0.1 - 0.9 x10E3/uL   EOS (ABSOLUTE) 0.2 0.0 - 0.4 x10E3/uL   Basophils Absolute 0.1 0.0 - 0.2 x10E3/uL   Immature Granulocytes 0 Not Estab. %   Immature Grans (Abs) 0.0 0.0 - 0.1 x10E3/uL  CMP14+EGFR     Status: Abnormal   Collection Time: 05/15/23  9:52 AM  Result Value Ref Range   Glucose 84 70 - 99 mg/dL   BUN 20 8 - 27 mg/dL   Creatinine, Ser 1.61 (H) 0.76 - 1.27 mg/dL   eGFR 45 (L) >09 UE/AVW/0.98   BUN/Creatinine Ratio 12 10 - 24   Sodium 141 134 - 144 mmol/L   Potassium 4.8 3.5 - 5.2 mmol/L   Chloride 104 96 - 106 mmol/L   CO2 21 20 - 29 mmol/L   Calcium 9.5 8.6 - 10.2 mg/dL   Total Protein 7.1 6.0 - 8.5 g/dL   Albumin 4.2 3.9 - 4.9 g/dL   Globulin, Total 2.9 1.5 - 4.5 g/dL   Bilirubin Total 0.4 0.0 - 1.2 mg/dL   Alkaline Phosphatase 91 44 - 121 IU/L   AST 37 0 - 40 IU/L   ALT 22 0 - 44 IU/L  Lipid Panel w/o Chol/HDL Ratio     Status: Abnormal   Collection Time: 05/15/23  9:52 AM  Result Value Ref Range   Cholesterol, Total 139 100 - 199 mg/dL   Triglycerides 119 (H) 0 - 149 mg/dL   HDL 54 >14 mg/dL   VLDL Cholesterol Cal 55 (H) 5 - 40 mg/dL   LDL Chol Calc (NIH) 30 0 - 99 mg/dL      Assessment & Plan:  Continue current medications.  Patient will pick up his Tricor and take it in addition to his atorvastatin. To proceed with a stress test and follow-up with the cardiologist. Problem List Items Addressed This Visit     Essential hypertension, benign - Primary   Mixed hyperlipidemia   Cigarette nicotine dependence without complication   Other Visit Diagnoses       Angina pectoris (HCC)           Follow up 3 months.  Total time spent: 25 minutes  [name], MD  06/15/2023   This document may have been prepared by [facility] Voice Recognition software and as such may include unintentional dictation errors.

## 2023-06-19 ENCOUNTER — Ambulatory Visit (INDEPENDENT_AMBULATORY_CARE_PROVIDER_SITE_OTHER): Payer: Medicaid Other

## 2023-06-19 DIAGNOSIS — I209 Angina pectoris, unspecified: Secondary | ICD-10-CM | POA: Diagnosis not present

## 2023-06-19 DIAGNOSIS — I5033 Acute on chronic diastolic (congestive) heart failure: Secondary | ICD-10-CM

## 2023-06-19 DIAGNOSIS — I351 Nonrheumatic aortic (valve) insufficiency: Secondary | ICD-10-CM | POA: Diagnosis not present

## 2023-06-19 DIAGNOSIS — I10 Essential (primary) hypertension: Secondary | ICD-10-CM

## 2023-06-19 DIAGNOSIS — E782 Mixed hyperlipidemia: Secondary | ICD-10-CM

## 2023-06-19 DIAGNOSIS — I340 Nonrheumatic mitral (valve) insufficiency: Secondary | ICD-10-CM

## 2023-06-19 MED ORDER — TECHNETIUM TC 99M SESTAMIBI GENERIC - CARDIOLITE
10.1000 | Freq: Once | INTRAVENOUS | Status: AC | PRN
Start: 2023-06-19 — End: 2023-06-19
  Administered 2023-06-19: 10.1 via INTRAVENOUS

## 2023-06-19 MED ORDER — TECHNETIUM TC 99M SESTAMIBI GENERIC - CARDIOLITE
31.0000 | Freq: Once | INTRAVENOUS | Status: AC | PRN
  Administered 2023-06-19: 31 via INTRAVENOUS

## 2023-06-20 ENCOUNTER — Ambulatory Visit: Payer: Medicaid Other | Admitting: Cardiovascular Disease

## 2023-07-03 ENCOUNTER — Encounter: Payer: Self-pay | Admitting: Cardiovascular Disease

## 2023-07-03 ENCOUNTER — Ambulatory Visit: Payer: Medicaid Other | Admitting: Cardiovascular Disease

## 2023-07-03 VITALS — BP 132/87 | HR 70 | Ht 72.0 in | Wt 162.0 lb

## 2023-07-03 DIAGNOSIS — E782 Mixed hyperlipidemia: Secondary | ICD-10-CM

## 2023-07-03 DIAGNOSIS — I10 Essential (primary) hypertension: Secondary | ICD-10-CM

## 2023-07-03 DIAGNOSIS — F17210 Nicotine dependence, cigarettes, uncomplicated: Secondary | ICD-10-CM | POA: Diagnosis not present

## 2023-07-03 DIAGNOSIS — R9439 Abnormal result of other cardiovascular function study: Secondary | ICD-10-CM

## 2023-07-03 DIAGNOSIS — I351 Nonrheumatic aortic (valve) insufficiency: Secondary | ICD-10-CM

## 2023-07-03 DIAGNOSIS — I340 Nonrheumatic mitral (valve) insufficiency: Secondary | ICD-10-CM

## 2023-07-03 DIAGNOSIS — I5033 Acute on chronic diastolic (congestive) heart failure: Secondary | ICD-10-CM

## 2023-07-03 NOTE — Progress Notes (Signed)
 Cardiology Office Note   Date:  07/03/2023   ID:  ARMAS MCBEE, DOB [birth year], MRN [id number]  PCP:  [name], MD  Cardiologist:  Adrian Blackwater, MD      History of Present Illness: Henry Gallagher is a 63 y.o. male who presents for  Chief Complaint  Patient presents with   Follow-up    5 week follow up  NST results    Doing well      Past Medical History:  Diagnosis Date   Arthritis    Benign prostatic hypertrophy    CHF (congestive heart failure) (HCC)    Chronic kidney disease    Depression    Hyperlipidemia    Hypertension      Past Surgical History:  Procedure Laterality Date   COLONOSCOPY WITH PROPOFOL N/A 05/18/2015   Procedure: COLONOSCOPY WITH PROPOFOL;  Surgeon: Wallace Cullens, MD;  Location: [facility] ENDOSCOPY;  Service: Gastroenterology;  Laterality: N/A;   TONSILLECTOMY AND ADENOIDECTOMY       Current Outpatient Medications  Medication Sig Dispense Refill   Cholecalciferol (VITAMIN D-1000 MAX ST) 25 MCG (1000 UT) tablet Take 1,000 Units by mouth daily.     amLODipine (NORVASC) 10 MG tablet Take 10 mg by mouth daily.     aspirin EC 81 MG tablet Take 81 mg by mouth daily.     atorvastatin (LIPITOR) 80 MG tablet Take 80 mg by mouth daily.     carvedilol (COREG) 12.5 MG tablet Take 12.5 mg by mouth 2 (two) times daily with a meal.     chlorthalidone (HYGROTON) 25 MG tablet Take 25 mg by mouth daily.     fenofibrate (TRICOR) 48 MG tablet Take 1 tablet (48 mg total) by mouth daily. 90 tablet 3   gabapentin (NEURONTIN) 100 MG capsule Take 1 capsule (100 mg total) by mouth 2 (two) times daily. 60 capsule 2   hydrALAZINE (APRESOLINE) 50 MG tablet Take 50 mg by mouth 3 (three) times daily.     lisinopril (ZESTRIL) 40 MG tablet Take 40 mg by mouth daily.     nitroGLYCERIN (NITROSTAT) 0.4 MG SL tablet Place 1 tablet (0.4 mg total) under the tongue every 5 (five) minutes as needed for chest pain. (Patient not taking: Reported on 05/15/2023) 100 tablet 3   No  current facility-administered medications for this visit.    Allergies:   Patient has no known allergies.    Social History:   reports that he has been smoking. He does not have any smokeless tobacco history on file. No history on file for alcohol use and drug use.   Family History:  family history is not on file.    ROS:     Review of Systems  Constitutional: Negative.   HENT: Negative.    Eyes: Negative.   Respiratory: Negative.    Gastrointestinal: Negative.   Genitourinary: Negative.   Musculoskeletal: Negative.   Skin: Negative.   Neurological: Negative.   Endo/Heme/Allergies: Negative.   Psychiatric/Behavioral: Negative.    All other systems reviewed and are negative.     All other systems are reviewed and negative.    PHYSICAL EXAM: VS:  BP 132/87   Pulse 70   Ht 6' (1.829 m)   Wt 162 lb (73.5 kg)   SpO2 98%   BMI 21.97 kg/m  , BMI Body mass index is 21.97 kg/m. Last weight:  Wt Readings from Last 3 Encounters:  07/03/23 162 lb (73.5 kg)  06/15/23 162 lb (  73.5 kg)  05/25/23 159 lb (72.1 kg)     Physical Exam Vitals reviewed.  Constitutional:      Appearance: Normal appearance. He is normal weight.  HENT:     Head: Normocephalic.     Nose: Nose normal.     Mouth/Throat:     Mouth: Mucous membranes are moist.  Eyes:     Pupils: Pupils are equal, round, and reactive to light.  Cardiovascular:     Rate and Rhythm: Normal rate and regular rhythm.     Pulses: Normal pulses.     Heart sounds: Normal heart sounds.  Pulmonary:     Effort: Pulmonary effort is normal.  Abdominal:     General: Abdomen is flat. Bowel sounds are normal.  Musculoskeletal:        General: Normal range of motion.     Cervical back: Normal range of motion.  Skin:    General: Skin is warm.  Neurological:     General: No focal deficit present.     Mental Status: He is alert.  Psychiatric:        Mood and Affect: Mood normal.       EKG:   Recent Labs: 05/15/2023:  ALT 22; BUN 20; Creatinine, Ser 1.67; Hemoglobin 14.2; Platelets 279; Potassium 4.8; Sodium 141    Lipid Panel    Component Value Date/Time   CHOL 139 05/15/2023 0952   TRIG 387 (H) 05/15/2023 0952   HDL 54 05/15/2023 0952   LDLCALC 30 05/15/2023 8295      Other studies Reviewed: Additional studies/ records that were reviewed today include:  Review of the above records demonstrates:       No data to display            ASSESSMENT AND PLAN:    ICD-10-CM   1. Abnormal nuclear stress test  R94.39    Mild ischaemia in RCa territory but asymptomatic, continue medical treatment.    2. Essential hypertension, benign  I10    BP 90/60, repeat and not dizzy.    3. Mixed hyperlipidemia  E78.2     4. Cigarette nicotine dependence without complication  F17.210     5. CHF (congestive heart failure), NYHA class III, acute on chronic, diastolic (HCC)  I50.33     6. Nonrheumatic aortic valve insufficiency  I35.1     7. Nonrheumatic mitral valve regurgitation  I34.0        Problem List Items Addressed This Visit       Cardiovascular and Mediastinum   Essential hypertension, benign   CHF (congestive heart failure), NYHA class III, acute on chronic, diastolic (HCC)     Other   Mixed hyperlipidemia   Cigarette nicotine dependence without complication   Other Visit Diagnoses       Abnormal nuclear stress test    -  Primary   Mild ischaemia in RCa territory but asymptomatic, continue medical treatment.     Nonrheumatic aortic valve insufficiency         Nonrheumatic mitral valve regurgitation              Disposition:   Return in about 3 months (around 10/03/2023).    Total time spent: 30 minutes  Signed,  Adrian Blackwater, MD  07/03/2023 9:40 AM    [facility]

## 2023-09-15 ENCOUNTER — Ambulatory Visit: Admitting: Internal Medicine

## 2023-09-21 ENCOUNTER — Encounter: Payer: Self-pay | Admitting: Internal Medicine

## 2023-09-21 ENCOUNTER — Ambulatory Visit: Admitting: Internal Medicine

## 2023-09-21 VITALS — BP 100/70 | HR 55 | Ht 72.0 in | Wt 172.0 lb

## 2023-09-21 DIAGNOSIS — I5033 Acute on chronic diastolic (congestive) heart failure: Secondary | ICD-10-CM

## 2023-09-21 DIAGNOSIS — E782 Mixed hyperlipidemia: Secondary | ICD-10-CM

## 2023-09-21 DIAGNOSIS — F17210 Nicotine dependence, cigarettes, uncomplicated: Secondary | ICD-10-CM

## 2023-09-21 DIAGNOSIS — I10 Essential (primary) hypertension: Secondary | ICD-10-CM | POA: Diagnosis not present

## 2023-09-21 NOTE — Progress Notes (Signed)
 Established Patient Office Visit  Subjective:  Patient ID: Henry Gallagher, male    DOB: 13-Sep-1960  Age: 63 y.o. MRN: [id number]  Chief Complaint  Patient presents with   Follow-up    3 month follow up    Patient comes in for his follow-up today.  He is generally feeling well and is not having any further chest pain or palpitations.  He is taking all his medications regularly.  His blood pressure is a little on the low side.  He is not complaining of lightheadedness or dizziness.  He has gained some weight and admits to being noncompliant with his diet.  Will start being careful with his diet although he exercises regularly.  Patient will return fasting for his blood work.  Continue to monitor blood pressure.  May return in 1 month to adjust his medications.    No other concerns at this time.   Past Medical History:  Diagnosis Date   Arthritis    Benign prostatic hypertrophy    CHF (congestive heart failure) (HCC)    Chronic kidney disease    Depression    Hyperlipidemia    Hypertension     Past Surgical History:  Procedure Laterality Date   COLONOSCOPY WITH PROPOFOL  N/A 05/18/2015   Procedure: COLONOSCOPY WITH PROPOFOL ;  Surgeon: Stephens Eis, MD;  Location: [facility] ENDOSCOPY;  Service: Gastroenterology;  Laterality: N/A;   TONSILLECTOMY AND ADENOIDECTOMY      Social History   Socioeconomic History   Marital status: Divorced    Spouse name: Not on file   Number of children: Not on file   Years of education: Not on file   Highest education level: Not on file  Occupational History   Not on file  Tobacco Use   Smoking status: Every Day   Smokeless tobacco: Not on file  Substance and Sexual Activity   Alcohol use: Not on file   Drug use: Not on file   Sexual activity: Not on file  Other Topics Concern   Not on file  Social History Narrative   Not on file   Social Drivers of Health   Financial Resource Strain: Not on file  Food Insecurity: Not on file  Transportation  Needs: Not on file  Physical Activity: Not on file  Stress: Not on file  Social Connections: Not on file  Intimate Partner Violence: Not on file    History reviewed. No pertinent family history.  No Known Allergies  Outpatient Medications Prior to Visit  Medication Sig   amLODipine (NORVASC) 10 MG tablet Take 10 mg by mouth daily.   aspirin EC 81 MG tablet Take 81 mg by mouth daily.   atorvastatin (LIPITOR) 80 MG tablet Take 80 mg by mouth daily.   carvedilol (COREG) 12.5 MG tablet Take 12.5 mg by mouth 2 (two) times daily with a meal.   chlorthalidone (HYGROTON) 25 MG tablet Take 25 mg by mouth daily.   Cholecalciferol (VITAMIN D-1000 MAX ST) 25 MCG (1000 UT) tablet Take 1,000 Units by mouth daily.   fenofibrate  (TRICOR ) 48 MG tablet Take 1 tablet (48 mg total) by mouth daily.   gabapentin  (NEURONTIN ) 100 MG capsule Take 1 capsule (100 mg total) by mouth 2 (two) times daily.   hydrALAZINE (APRESOLINE) 50 MG tablet Take 50 mg by mouth 3 (three) times daily.   lisinopril (ZESTRIL) 40 MG tablet Take 40 mg by mouth daily.   nitroGLYCERIN  (NITROSTAT ) 0.4 MG SL tablet Place 1 tablet (0.4 mg total)  under the tongue every 5 (five) minutes as needed for chest pain.   No facility-administered medications prior to visit.    Review of Systems  Constitutional: Negative.  Negative for chills, fever, malaise/fatigue and weight loss.  HENT: Negative.  Negative for sore throat.   Eyes: Negative.   Respiratory: Negative.  Negative for cough and shortness of breath.   Cardiovascular: Negative.  Negative for chest pain, palpitations and leg swelling.  Gastrointestinal: Negative.  Negative for abdominal pain, constipation, diarrhea, heartburn, nausea and vomiting.  Genitourinary: Negative.  Negative for dysuria and flank pain.  Musculoskeletal: Negative.  Negative for joint pain and myalgias.  Skin: Negative.   Neurological: Negative.  Negative for dizziness, tingling, tremors and headaches.   Endo/Heme/Allergies: Negative.   Psychiatric/Behavioral: Negative.  Negative for depression and suicidal ideas. The patient is not nervous/anxious.        Objective:   BP 100/70   Pulse (!) 55   Ht 6' (1.829 m)   Wt 172 lb (78 kg)   SpO2 98%   BMI 23.33 kg/m   Vitals:   09/21/23 0918  BP: 100/70  Pulse: (!) 55  Height: 6' (1.829 m)  Weight: 172 lb (78 kg)  SpO2: 98%  BMI (Calculated): 23.32    Physical Exam Vitals and nursing note reviewed.  Constitutional:      Appearance: Normal appearance.  HENT:     Head: Normocephalic and atraumatic.     Nose: Nose normal.     Mouth/Throat:     Mouth: Mucous membranes are moist.     Pharynx: Oropharynx is clear.   Eyes:     Conjunctiva/sclera: Conjunctivae normal.     Pupils: Pupils are equal, round, and reactive to light.    Cardiovascular:     Rate and Rhythm: Normal rate and regular rhythm.     Pulses: Normal pulses.     Heart sounds: Normal heart sounds.  Pulmonary:     Effort: Pulmonary effort is normal.     Breath sounds: Normal breath sounds.  Abdominal:     General: Bowel sounds are normal.     Palpations: Abdomen is soft.   Musculoskeletal:        General: Normal range of motion.     Cervical back: Normal range of motion.   Skin:    General: Skin is warm and dry.   Neurological:     General: No focal deficit present.     Mental Status: He is alert and oriented to person, place, and time.   Psychiatric:        Mood and Affect: Mood normal.        Behavior: Behavior normal.        Judgment: Judgment normal.      No results found for any visits on 09/21/23.  No results found for this or any previous visit (from the past 2160 hours).    Assessment & Plan:  Continue current meds.  Strict diet control.  Return for fasting labs.  Monitor blood pressure at home.  Return in 1 month to adjust medications if needed. Problem List Items Addressed This Visit     Essential hypertension, benign -  Primary   Relevant Orders   CMP14+EGFR   CBC with Diff   CHF (congestive heart failure), NYHA class III, acute on chronic, diastolic (HCC)   Relevant Orders   CBC with Diff   Mixed hyperlipidemia   Relevant Orders   Lipid Profile   Cigarette nicotine dependence without  complication    Return in about 1 month (around 10/21/2023).   Total time spent: 30 minutes  Aisha Hove, MD  09/21/2023   This document may have been prepared by [facility] Voice Recognition software and as such may include unintentional dictation errors.

## 2023-09-27 ENCOUNTER — Other Ambulatory Visit

## 2023-09-28 ENCOUNTER — Ambulatory Visit: Payer: Self-pay | Admitting: Internal Medicine

## 2023-09-28 LAB — LIPID PANEL
Chol/HDL Ratio: 2.7 ratio (ref 0.0–5.0)
Cholesterol, Total: 117 mg/dL (ref 100–199)
HDL: 43 mg/dL (ref >39)
LDL Chol Calc (NIH): 34 mg/dL (ref 0–99)
Triglycerides: 263 mg/dL — ABNORMAL HIGH (ref 0–149)
VLDL Cholesterol Cal: 40 mg/dL (ref 5–40)

## 2023-09-28 LAB — CMP14+EGFR
ALT: 12 IU/L (ref 0–44)
AST: 23 IU/L (ref 0–40)
Albumin: 4 g/dL (ref 3.9–4.9)
Alkaline Phosphatase: 55 IU/L (ref 44–121)
BUN/Creatinine Ratio: 10 (ref 10–24)
BUN: 18 mg/dL (ref 8–27)
Bilirubin Total: 0.5 mg/dL (ref 0.0–1.2)
CO2: 18 mmol/L — ABNORMAL LOW (ref 20–29)
Calcium: 9.6 mg/dL (ref 8.6–10.2)
Chloride: 108 mmol/L — ABNORMAL HIGH (ref 96–106)
Creatinine, Ser: 1.87 mg/dL — ABNORMAL HIGH (ref 0.76–1.27)
Globulin, Total: 2.2 g/dL (ref 1.5–4.5)
Glucose: 83 mg/dL (ref 70–99)
Potassium: 4.2 mmol/L (ref 3.5–5.2)
Sodium: 141 mmol/L (ref 134–144)
Total Protein: 6.2 g/dL (ref 6.0–8.5)
eGFR: 40 mL/min/{1.73_m2} — ABNORMAL LOW (ref >59)

## 2023-09-28 LAB — CBC WITH DIFFERENTIAL/PLATELET
Basophils Absolute: 0 10*3/uL (ref 0.0–0.2)
Basos: 1 %
EOS (ABSOLUTE): 0.2 10*3/uL (ref 0.0–0.4)
Eos: 7 %
Hematocrit: 46.5 % (ref 37.5–51.0)
Hemoglobin: 15.8 g/dL (ref 13.0–17.7)
Immature Grans (Abs): 0 10*3/uL (ref 0.0–0.1)
Immature Granulocytes: 0 %
Lymphocytes Absolute: 1.8 10*3/uL (ref 0.7–3.1)
Lymphs: 52 %
MCH: 35.1 pg — ABNORMAL HIGH (ref 26.6–33.0)
MCHC: 34 g/dL (ref 31.5–35.7)
MCV: 103 fL — ABNORMAL HIGH (ref 79–97)
Monocytes Absolute: 0.3 10*3/uL (ref 0.1–0.9)
Monocytes: 9 %
Neutrophils Absolute: 1.1 10*3/uL — ABNORMAL LOW (ref 1.4–7.0)
Neutrophils: 31 %
Platelets: 199 10*3/uL (ref 150–450)
RBC: 4.5 x10E6/uL (ref 4.14–5.80)
RDW: 14.6 % (ref 11.6–15.4)
WBC: 3.5 10*3/uL (ref 3.4–10.8)

## 2023-10-03 ENCOUNTER — Encounter: Payer: Self-pay | Admitting: Cardiovascular Disease

## 2023-10-03 ENCOUNTER — Ambulatory Visit: Admitting: Cardiovascular Disease

## 2023-10-03 VITALS — BP 118/70 | HR 62 | Ht 72.0 in | Wt 172.4 lb

## 2023-10-03 DIAGNOSIS — E782 Mixed hyperlipidemia: Secondary | ICD-10-CM | POA: Diagnosis not present

## 2023-10-03 DIAGNOSIS — F17210 Nicotine dependence, cigarettes, uncomplicated: Secondary | ICD-10-CM

## 2023-10-03 DIAGNOSIS — I10 Essential (primary) hypertension: Secondary | ICD-10-CM | POA: Diagnosis not present

## 2023-10-03 DIAGNOSIS — I5033 Acute on chronic diastolic (congestive) heart failure: Secondary | ICD-10-CM

## 2023-10-03 NOTE — Progress Notes (Signed)
 Cardiology Office Note   Date:  10/03/2023   ID:  Henry Gallagher, DOB March 26, 1961, MRN [id number]  PCP:  Fernand Fredy RAMAN, MD  Cardiologist:  Denyse Fernand, MD      History of Present Illness: Henry Gallagher is a 63 y.o. male who presents for  Chief Complaint  Patient presents with   Follow-up    3 month follow up    Doing well      Past Medical History:  Diagnosis Date   Arthritis    Benign prostatic hypertrophy    CHF (congestive heart failure) (HCC)    Chronic kidney disease    Depression    Hyperlipidemia    Hypertension      Past Surgical History:  Procedure Laterality Date   COLONOSCOPY WITH PROPOFOL  N/A 05/18/2015   Procedure: COLONOSCOPY WITH PROPOFOL ;  Surgeon: Deward CINDERELLA Piedmont, MD;  Location: [facility] ENDOSCOPY;  Service: Gastroenterology;  Laterality: N/A;   TONSILLECTOMY AND ADENOIDECTOMY       Current Outpatient Medications  Medication Sig Dispense Refill   amLODipine (NORVASC) 10 MG tablet Take 10 mg by mouth daily.     aspirin EC 81 MG tablet Take 81 mg by mouth daily.     atorvastatin (LIPITOR) 80 MG tablet Take 80 mg by mouth daily.     carvedilol (COREG) 12.5 MG tablet Take 12.5 mg by mouth 2 (two) times daily with a meal.     chlorthalidone (HYGROTON) 25 MG tablet Take 25 mg by mouth daily.     Cholecalciferol (VITAMIN D-1000 MAX ST) 25 MCG (1000 UT) tablet Take 1,000 Units by mouth daily.     fenofibrate  (TRICOR ) 48 MG tablet Take 1 tablet (48 mg total) by mouth daily. 90 tablet 3   gabapentin  (NEURONTIN ) 100 MG capsule Take 1 capsule (100 mg total) by mouth 2 (two) times daily. 60 capsule 2   hydrALAZINE (APRESOLINE) 50 MG tablet Take 50 mg by mouth 3 (three) times daily.     lisinopril (ZESTRIL) 40 MG tablet Take 40 mg by mouth daily.     nitroGLYCERIN  (NITROSTAT ) 0.4 MG SL tablet Place 1 tablet (0.4 mg total) under the tongue every 5 (five) minutes as needed for chest pain. 100 tablet 3   No current facility-administered medications for this visit.     Allergies:   Patient has no known allergies.    Social History:   reports that he has been smoking. He does not have any smokeless tobacco history on file. No history on file for alcohol use and drug use.   Family History:  family history is not on file.    ROS:     Review of Systems  Constitutional: Negative.   HENT: Negative.    Eyes: Negative.   Respiratory: Negative.    Gastrointestinal: Negative.   Genitourinary: Negative.   Musculoskeletal: Negative.   Skin: Negative.   Neurological: Negative.   Endo/Heme/Allergies: Negative.   Psychiatric/Behavioral: Negative.    All other systems reviewed and are negative.     All other systems are reviewed and negative.    PHYSICAL EXAM: VS:  BP 118/70   Pulse 62   Ht 6' (1.829 m)   Wt 172 lb 6.4 oz (78.2 kg)   SpO2 98%   BMI 23.38 kg/m  , BMI Body mass index is 23.38 kg/m. Last weight:  Wt Readings from Last 3 Encounters:  10/03/23 172 lb 6.4 oz (78.2 kg)  09/21/23 172 lb (78 kg)  07/03/23 162  lb (73.5 kg)     Physical Exam Vitals reviewed.  Constitutional:      Appearance: Normal appearance. He is normal weight.  HENT:     Head: Normocephalic.     Nose: Nose normal.     Mouth/Throat:     Mouth: Mucous membranes are moist.   Eyes:     Pupils: Pupils are equal, round, and reactive to light.    Cardiovascular:     Rate and Rhythm: Normal rate and regular rhythm.     Pulses: Normal pulses.     Heart sounds: Normal heart sounds.  Pulmonary:     Effort: Pulmonary effort is normal.  Abdominal:     General: Abdomen is flat. Bowel sounds are normal.   Musculoskeletal:        General: Normal range of motion.     Cervical back: Normal range of motion.   Skin:    General: Skin is warm.   Neurological:     General: No focal deficit present.     Mental Status: He is alert.   Psychiatric:        Mood and Affect: Mood normal.       EKG:   Recent Labs: 09/27/2023: ALT 12; BUN 18; Creatinine, Ser  1.87; Hemoglobin 15.8; Platelets 199; Potassium 4.2; Sodium 141    Lipid Panel    Component Value Date/Time   CHOL 117 09/27/2023 1019   TRIG 263 (H) 09/27/2023 1019   HDL 43 09/27/2023 1019   CHOLHDL 2.7 09/27/2023 1019   LDLCALC 34 09/27/2023 1019      Other studies Reviewed: Additional studies/ records that were reviewed today include:  Review of the above records demonstrates:       No data to display            ASSESSMENT AND PLAN:    ICD-10-CM   1. CHF (congestive heart failure), NYHA class III, acute on chronic, diastolic (HCC)  I50.33    No SOB, stable    2. Essential hypertension, benign  I10     3. Cigarette nicotine dependence without complication  F17.210     4. Mixed hyperlipidemia  E78.2        Problem List Items Addressed This Visit       Cardiovascular and Mediastinum   Essential hypertension, benign   CHF (congestive heart failure), NYHA class III, acute on chronic, diastolic (HCC) - Primary     Other   Mixed hyperlipidemia   Cigarette nicotine dependence without complication       Disposition:   Return in about 3 months (around 01/03/2024).    Total time spent: 35 minutes  Signed,  Denyse Bathe, MD  10/03/2023 9:28 AM    [facility]

## 2023-10-24 ENCOUNTER — Ambulatory Visit: Admitting: Internal Medicine

## 2023-10-24 ENCOUNTER — Encounter: Payer: Self-pay | Admitting: Internal Medicine

## 2023-10-24 VITALS — BP 114/64 | HR 57 | Ht 72.0 in | Wt 170.4 lb

## 2023-10-24 DIAGNOSIS — I5033 Acute on chronic diastolic (congestive) heart failure: Secondary | ICD-10-CM | POA: Diagnosis not present

## 2023-10-24 DIAGNOSIS — E782 Mixed hyperlipidemia: Secondary | ICD-10-CM | POA: Diagnosis not present

## 2023-10-24 DIAGNOSIS — I10 Essential (primary) hypertension: Secondary | ICD-10-CM | POA: Diagnosis not present

## 2023-10-24 DIAGNOSIS — F17210 Nicotine dependence, cigarettes, uncomplicated: Secondary | ICD-10-CM

## 2023-10-24 NOTE — Progress Notes (Signed)
 Established Patient Office Visit  Subjective:  Patient ID: Henry Gallagher, male    DOB: 10-05-1960  Age: 63 y.o. MRN: [id number]  Chief Complaint  Patient presents with   Follow-up    1 month follow up    Patient comes in for his follow-up today.  His blood pressure is looking better, and does not have any complaints of headaches or dizziness.  He also managed to lose 2 pounds with diet control.  Will continue his current medications for now.    No other concerns at this time.   Past Medical History:  Diagnosis Date   Arthritis    Benign prostatic hypertrophy    CHF (congestive heart failure) (HCC)    Chronic kidney disease    Depression    Hyperlipidemia    Hypertension     Past Surgical History:  Procedure Laterality Date   COLONOSCOPY WITH PROPOFOL  N/A 05/18/2015   Procedure: COLONOSCOPY WITH PROPOFOL ;  Surgeon: Deward CINDERELLA Piedmont, MD;  Location: [facility] ENDOSCOPY;  Service: Gastroenterology;  Laterality: N/A;   TONSILLECTOMY AND ADENOIDECTOMY      Social History   Socioeconomic History   Marital status: Divorced    Spouse name: Not on file   Number of children: Not on file   Years of education: Not on file   Highest education level: Not on file  Occupational History   Not on file  Tobacco Use   Smoking status: Every Day   Smokeless tobacco: Not on file  Substance and Sexual Activity   Alcohol use: Not on file   Drug use: Not on file   Sexual activity: Not on file  Other Topics Concern   Not on file  Social History Narrative   Not on file   Social Drivers of Health   Financial Resource Strain: Not on file  Food Insecurity: Not on file  Transportation Needs: Not on file  Physical Activity: Not on file  Stress: Not on file  Social Connections: Not on file  Intimate Partner Violence: Not on file    History reviewed. No pertinent family history.  No Known Allergies  Outpatient Medications Prior to Visit  Medication Sig   amLODipine (NORVASC) 10 MG tablet  Take 10 mg by mouth daily.   aspirin EC 81 MG tablet Take 81 mg by mouth daily.   atorvastatin (LIPITOR) 80 MG tablet Take 80 mg by mouth daily.   carvedilol (COREG) 12.5 MG tablet Take 12.5 mg by mouth 2 (two) times daily with a meal.   chlorthalidone (HYGROTON) 25 MG tablet Take 25 mg by mouth daily.   Cholecalciferol (VITAMIN D-1000 MAX ST) 25 MCG (1000 UT) tablet Take 1,000 Units by mouth daily.   fenofibrate  (TRICOR ) 48 MG tablet Take 1 tablet (48 mg total) by mouth daily.   gabapentin  (NEURONTIN ) 100 MG capsule Take 1 capsule (100 mg total) by mouth 2 (two) times daily.   hydrALAZINE (APRESOLINE) 50 MG tablet Take 50 mg by mouth 3 (three) times daily.   lisinopril (ZESTRIL) 40 MG tablet Take 40 mg by mouth daily.   nitroGLYCERIN  (NITROSTAT ) 0.4 MG SL tablet Place 1 tablet (0.4 mg total) under the tongue every 5 (five) minutes as needed for chest pain.   No facility-administered medications prior to visit.    Review of Systems  Constitutional: Negative.  Negative for chills, fever, malaise/fatigue and weight loss.  HENT: Negative.  Negative for sore throat.   Eyes: Negative.   Respiratory: Negative.  Negative for cough and  shortness of breath.   Cardiovascular: Negative.  Negative for chest pain, palpitations and leg swelling.  Gastrointestinal: Negative.  Negative for abdominal pain, constipation, diarrhea, heartburn, nausea and vomiting.  Genitourinary: Negative.  Negative for dysuria and flank pain.  Musculoskeletal: Negative.  Negative for joint pain and myalgias.  Skin: Negative.   Neurological: Negative.  Negative for dizziness, tingling, tremors and headaches.  Endo/Heme/Allergies: Negative.   Psychiatric/Behavioral: Negative.  Negative for depression and suicidal ideas. The patient is not nervous/anxious.        Objective:   BP 114/64   Pulse (!) 57   Ht 6' (1.829 m)   Wt 170 lb 6.4 oz (77.3 kg)   SpO2 98%   BMI 23.11 kg/m   Vitals:   10/24/23 0906  BP: 114/64   Pulse: (!) 57  Height: 6' (1.829 m)  Weight: 170 lb 6.4 oz (77.3 kg)  SpO2: 98%  BMI (Calculated): 23.11    Physical Exam Vitals and nursing note reviewed.  Constitutional:      Appearance: Normal appearance.  HENT:     Head: Normocephalic and atraumatic.     Nose: Nose normal.     Mouth/Throat:     Mouth: Mucous membranes are moist.     Pharynx: Oropharynx is clear.  Eyes:     Conjunctiva/sclera: Conjunctivae normal.     Pupils: Pupils are equal, round, and reactive to light.  Cardiovascular:     Rate and Rhythm: Normal rate and regular rhythm.     Pulses: Normal pulses.     Heart sounds: Normal heart sounds.  Pulmonary:     Effort: Pulmonary effort is normal.     Breath sounds: Normal breath sounds.  Abdominal:     General: Bowel sounds are normal.     Palpations: Abdomen is soft.  Musculoskeletal:        General: Normal range of motion.     Cervical back: Normal range of motion.  Skin:    General: Skin is warm and dry.  Neurological:     General: No focal deficit present.     Mental Status: He is alert and oriented to person, place, and time.  Psychiatric:        Mood and Affect: Mood normal.        Behavior: Behavior normal.        Judgment: Judgment normal.      No results found for any visits on 10/24/23.  Recent Results (from the past 2160 hours)  CMP14+EGFR     Status: Abnormal   Collection Time: 09/27/23 10:19 AM  Result Value Ref Range   Glucose 83 70 - 99 mg/dL   BUN 18 8 - 27 mg/dL   Creatinine, Ser 8.12 (H) 0.76 - 1.27 mg/dL   eGFR 40 (L) >40 fO/fpw/8.26   BUN/Creatinine Ratio 10 10 - 24   Sodium 141 134 - 144 mmol/L   Potassium 4.2 3.5 - 5.2 mmol/L   Chloride 108 (H) 96 - 106 mmol/L   CO2 18 (L) 20 - 29 mmol/L   Calcium 9.6 8.6 - 10.2 mg/dL   Total Protein 6.2 6.0 - 8.5 g/dL   Albumin 4.0 3.9 - 4.9 g/dL   Globulin, Total 2.2 1.5 - 4.5 g/dL   Bilirubin Total 0.5 0.0 - 1.2 mg/dL   Alkaline Phosphatase 55 44 - 121 IU/L   AST 23 0 - 40  IU/L   ALT 12 0 - 44 IU/L  Lipid Profile     Status: Abnormal  Collection Time: 09/27/23 10:19 AM  Result Value Ref Range   Cholesterol, Total 117 100 - 199 mg/dL   Triglycerides 736 (H) 0 - 149 mg/dL   HDL 43 >60 mg/dL   VLDL Cholesterol Cal 40 5 - 40 mg/dL   LDL Chol Calc (NIH) 34 0 - 99 mg/dL   Chol/HDL Ratio 2.7 0.0 - 5.0 ratio    Comment:                                   T. Chol/HDL Ratio                                             Men  Women                               1/2 Avg.Risk  3.4    3.3                                   Avg.Risk  5.0    4.4                                2X Avg.Risk  9.6    7.1                                3X Avg.Risk 23.4   11.0   CBC with Diff     Status: Abnormal   Collection Time: 09/27/23 10:19 AM  Result Value Ref Range   WBC 3.5 3.4 - 10.8 x10E3/uL   RBC 4.50 4.14 - 5.80 x10E6/uL   Hemoglobin 15.8 13.0 - 17.7 g/dL   Hematocrit 53.4 62.4 - 51.0 %   MCV 103 (H) 79 - 97 fL   MCH 35.1 (H) 26.6 - 33.0 pg   MCHC 34.0 31.5 - 35.7 g/dL   RDW 85.3 88.3 - 84.5 %   Platelets 199 150 - 450 x10E3/uL   Neutrophils 31 Not Estab. %   Lymphs 52 Not Estab. %   Monocytes 9 Not Estab. %   Eos 7 Not Estab. %   Basos 1 Not Estab. %   Neutrophils Absolute 1.1 (L) 1.4 - 7.0 x10E3/uL   Lymphocytes Absolute 1.8 0.7 - 3.1 x10E3/uL   Monocytes Absolute 0.3 0.1 - 0.9 x10E3/uL   EOS (ABSOLUTE) 0.2 0.0 - 0.4 x10E3/uL   Basophils Absolute 0.0 0.0 - 0.2 x10E3/uL   Immature Granulocytes 0 Not Estab. %   Immature Grans (Abs) 0.0 0.0 - 0.1 x10E3/uL      Assessment & Plan:  Continue current medications.  Monitor weight and blood pressure. Problem List Items Addressed This Visit     Essential hypertension, benign - Primary   CHF (congestive heart failure), NYHA class III, acute on chronic, diastolic (HCC)   Mixed hyperlipidemia   Cigarette nicotine dependence without complication    Return in about 3 months (around 01/24/2024).   Total time spent: 25  minutes  FERNAND FREDY RAMAN, MD  10/24/2023   This document may have been prepared by [facility] Voice Recognition software and as  such may include unintentional dictation errors.

## 2023-12-26 ENCOUNTER — Encounter: Payer: Self-pay | Admitting: Internal Medicine

## 2023-12-26 ENCOUNTER — Ambulatory Visit (INDEPENDENT_AMBULATORY_CARE_PROVIDER_SITE_OTHER): Admitting: Internal Medicine

## 2023-12-26 VITALS — BP 122/72 | HR 60 | Ht 72.0 in | Wt 172.0 lb

## 2023-12-26 DIAGNOSIS — Z125 Encounter for screening for malignant neoplasm of prostate: Secondary | ICD-10-CM | POA: Insufficient documentation

## 2023-12-26 DIAGNOSIS — N1832 Chronic kidney disease, stage 3b: Secondary | ICD-10-CM | POA: Diagnosis not present

## 2023-12-26 DIAGNOSIS — I10 Essential (primary) hypertension: Secondary | ICD-10-CM | POA: Diagnosis not present

## 2023-12-26 DIAGNOSIS — E782 Mixed hyperlipidemia: Secondary | ICD-10-CM | POA: Diagnosis not present

## 2023-12-26 NOTE — Progress Notes (Signed)
 Established Patient Office Visit  Subjective:  Patient ID: Henry Gallagher, male    DOB: 1961/01/06  Age: 63 y.o. MRN: [id number]  Chief Complaint  Patient presents with   Follow-up    2 month follow up    Patient is here today for follow up. Patient says he feels well today and has no complaints. His blood pressure is stable today. He is due for labs. Will order fasting blood work to be collected. 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 Will send referral to Nephrology to get him re-established with them to monitor his CKD. Patient denies headache, chest pain, shortness of breath. Next Colonoscopy due in 2027.    No other concerns at this time.   Past Medical History:  Diagnosis Date   Arthritis    Benign prostatic hypertrophy    CHF (congestive heart failure) (HCC)    Chronic kidney disease    Depression    Hyperlipidemia    Hypertension     Past Surgical History:  Procedure Laterality Date   COLONOSCOPY WITH PROPOFOL  N/A 05/18/2015   Procedure: COLONOSCOPY WITH PROPOFOL ;  Surgeon: Deward CINDERELLA Piedmont, MD;  Location: [facility] ENDOSCOPY;  Service: Gastroenterology;  Laterality: N/A;   TONSILLECTOMY AND ADENOIDECTOMY      Social History   Socioeconomic History   Marital status: Divorced    Spouse name: Not on file   Number of children: Not on file   Years of education: Not on file   Highest education level: Not on file  Occupational History   Not on file  Tobacco Use   Smoking status: Every Day   Smokeless tobacco: Not on file  Substance and Sexual Activity   Alcohol use: Not on file   Drug use: Not on file   Sexual activity: Not on file  Other Topics Concern   Not on file  Social History Narrative   Not on file   Social Drivers of Health   Financial Resource Strain: Not on file  Food Insecurity: Not on file  Transportation Needs: Not on file  Physical Activity: Not on file   Stress: Not on file  Social Connections: Not on file  Intimate Partner Violence: Not on file    No family history on file.  No Known Allergies  Outpatient Medications Prior to Visit  Medication Sig   amLODipine (NORVASC) 10 MG tablet Take 10 mg by mouth daily.   aspirin EC 81 MG tablet Take 81 mg by mouth daily.   atorvastatin (LIPITOR) 80 MG tablet Take 80 mg by mouth daily.   carvedilol (COREG) 12.5 MG tablet Take 12.5 mg by mouth 2 (two) times daily with a meal.   chlorthalidone (HYGROTON) 25 MG tablet Take 25 mg by mouth daily.   Cholecalciferol (VITAMIN D-1000 MAX ST) 25 MCG (1000 UT) tablet Take 1,000 Units by mouth daily.   fenofibrate  (TRICOR ) 48 MG tablet Take 1 tablet (48 mg total) by mouth daily.   gabapentin  (NEURONTIN ) 100 MG capsule Take 1 capsule (100 mg total) by mouth 2 (two) times daily.   hydrALAZINE (APRESOLINE) 50 MG tablet Take 50 mg by mouth 3 (three) times daily.   lisinopril (ZESTRIL) 40 MG tablet Take 40 mg by mouth daily.   nitroGLYCERIN  (NITROSTAT ) 0.4 MG SL tablet Place 1 tablet (0.4 mg total) under the tongue every 5 (five) minutes as needed for chest pain.  No facility-administered medications prior to visit.    Review of Systems  Constitutional: Negative.  Negative for chills, fever and malaise/fatigue.  HENT: Negative.  Negative for congestion and sore throat.   Eyes: Negative.  Negative for blurred vision and pain.  Respiratory: Negative.  Negative for cough and shortness of breath.   Cardiovascular: Negative.  Negative for chest pain, palpitations and leg swelling.  Gastrointestinal: Negative.  Negative for abdominal pain, blood in stool, constipation, diarrhea, heartburn, melena, nausea and vomiting.  Genitourinary: Negative.  Negative for dysuria, flank pain, frequency and urgency.  Musculoskeletal: Negative.  Negative for joint pain and myalgias.  Skin: Negative.   Neurological: Negative.  Negative for dizziness, tingling, sensory change,  weakness and headaches.  Endo/Heme/Allergies: Negative.   Psychiatric/Behavioral: Negative.  Negative for depression and suicidal ideas. The patient is not nervous/anxious.        Objective:   BP 122/72   Pulse 60   Ht 6' (1.829 m)   Wt 172 lb (78 kg)   SpO2 98%   BMI 23.33 kg/m   Vitals:   12/26/23 0936  BP: 122/72  Pulse: 60  Height: 6' (1.829 m)  Weight: 172 lb (78 kg)  SpO2: 98%  BMI (Calculated): 23.32    Physical Exam Vitals and nursing note reviewed.  Constitutional:      General: He is not in acute distress.    Appearance: Normal appearance. He is not ill-appearing.  HENT:     Head: Normocephalic and atraumatic.     Nose: Nose normal.     Mouth/Throat:     Mouth: Mucous membranes are moist.     Pharynx: Oropharynx is clear.  Eyes:     Conjunctiva/sclera: Conjunctivae normal.     Pupils: Pupils are equal, round, and reactive to light.  Cardiovascular:     Rate and Rhythm: Normal rate and regular rhythm.     Pulses: Normal pulses.     Heart sounds: Normal heart sounds.  Pulmonary:     Effort: Pulmonary effort is normal.     Breath sounds: Normal breath sounds. No wheezing or rhonchi.  Abdominal:     General: Bowel sounds are normal. There is no distension.     Palpations: Abdomen is soft.     Tenderness: There is no abdominal tenderness.  Musculoskeletal:        General: Normal range of motion.     Cervical back: Normal range of motion and neck supple.     Right lower leg: No edema.     Left lower leg: No edema.  Skin:    General: Skin is warm and dry.     Capillary Refill: Capillary refill takes less than 2 seconds.  Neurological:     General: No focal deficit present.     Mental Status: He is alert and oriented to person, place, and time.     Sensory: No sensory deficit.     Motor: No weakness.  Psychiatric:        Mood and Affect: Mood normal.        Behavior: Behavior normal.        Judgment: Judgment normal.      No results found for  any visits on 12/26/23.  No results found for this or any previous visit (from the past 2160 hours).     Assessment & Plan:  Continue taking medications as prescribed. Will collect routine fasting blood work today and FU with patient on the results. Will send referral to  previous Nephrologist to re-establish care. Problem List Items Addressed This Visit     Essential hypertension, benign - Primary   Relevant Orders   Hemoglobin A1c   Ambulatory referral to Nephrology   Mixed hyperlipidemia   Relevant Orders   Lipid Panel w/o Chol/HDL Ratio   Prostate cancer screening   Relevant Orders   PSA   Stage 3b chronic kidney disease (HCC)   Relevant Orders   CBC with Diff   CMP14+EGFR   Hemoglobin A1c   Ambulatory referral to Nephrology    Follow up 3 months.   Total time spent: 30 minutes  FERNAND FREDY RAMAN, MD  12/26/2023   This document may have been prepared by [facility] Voice Recognition software and as such may include unintentional dictation errors.

## 2023-12-27 LAB — CBC WITH DIFFERENTIAL/PLATELET
Basophils Absolute: 0 x10E3/uL (ref 0.0–0.2)
Basos: 1 %
EOS (ABSOLUTE): 0.4 x10E3/uL (ref 0.0–0.4)
Eos: 8 %
Hematocrit: 43.5 % (ref 37.5–51.0)
Hemoglobin: 14.8 g/dL (ref 13.0–17.7)
Immature Grans (Abs): 0 x10E3/uL (ref 0.0–0.1)
Immature Granulocytes: 0 %
Lymphocytes Absolute: 1.9 x10E3/uL (ref 0.7–3.1)
Lymphs: 39 %
MCH: 34.3 pg — ABNORMAL HIGH (ref 26.6–33.0)
MCHC: 34 g/dL (ref 31.5–35.7)
MCV: 101 fL — ABNORMAL HIGH (ref 79–97)
Monocytes Absolute: 0.6 x10E3/uL (ref 0.1–0.9)
Monocytes: 12 %
Neutrophils Absolute: 1.9 x10E3/uL (ref 1.4–7.0)
Neutrophils: 40 %
Platelets: 224 x10E3/uL (ref 150–450)
RBC: 4.32 x10E6/uL (ref 4.14–5.80)
RDW: 13.9 % (ref 11.6–15.4)
WBC: 4.9 x10E3/uL (ref 3.4–10.8)

## 2023-12-27 LAB — PSA: Prostate Specific Ag, Serum: 0.6 ng/mL (ref 0.0–4.0)

## 2023-12-27 LAB — CMP14+EGFR
ALT: 16 IU/L (ref 0–44)
AST: 28 IU/L (ref 0–40)
Albumin: 4.1 g/dL (ref 3.9–4.9)
Alkaline Phosphatase: 59 IU/L (ref 47–123)
BUN/Creatinine Ratio: 9 — ABNORMAL LOW (ref 10–24)
BUN: 17 mg/dL (ref 8–27)
Bilirubin Total: 0.5 mg/dL (ref 0.0–1.2)
CO2: 22 mmol/L (ref 20–29)
Calcium: 9.7 mg/dL (ref 8.6–10.2)
Chloride: 103 mmol/L (ref 96–106)
Creatinine, Ser: 1.84 mg/dL — ABNORMAL HIGH (ref 0.76–1.27)
Globulin, Total: 2.5 g/dL (ref 1.5–4.5)
Glucose: 84 mg/dL (ref 70–99)
Potassium: 4.3 mmol/L (ref 3.5–5.2)
Sodium: 138 mmol/L (ref 134–144)
Total Protein: 6.6 g/dL (ref 6.0–8.5)
eGFR: 40 mL/min/1.73 — ABNORMAL LOW (ref >59)

## 2023-12-27 LAB — LIPID PANEL W/O CHOL/HDL RATIO
Cholesterol, Total: 128 mg/dL (ref 100–199)
HDL: 44 mg/dL (ref >39)
LDL Chol Calc (NIH): 37 mg/dL (ref 0–99)
Triglycerides: 319 mg/dL — ABNORMAL HIGH (ref 0–149)
VLDL Cholesterol Cal: 47 mg/dL — ABNORMAL HIGH (ref 5–40)

## 2023-12-27 LAB — HEMOGLOBIN A1C
Est. average glucose Bld gHb Est-mCnc: 108 mg/dL
Hgb A1c MFr Bld: 5.4 % (ref 4.8–5.6)

## 2023-12-28 ENCOUNTER — Ambulatory Visit: Payer: Self-pay | Admitting: Internal Medicine

## 2023-12-28 DIAGNOSIS — E782 Mixed hyperlipidemia: Secondary | ICD-10-CM

## 2023-12-28 MED ORDER — FENOFIBRATE 145 MG PO TABS
145.0000 mg | ORAL_TABLET | Freq: Every day | ORAL | 3 refills | Status: AC
Start: 2023-12-28 — End: ?

## 2024-01-01 NOTE — Progress Notes (Signed)
 Patient notified

## 2024-01-04 ENCOUNTER — Ambulatory Visit (INDEPENDENT_AMBULATORY_CARE_PROVIDER_SITE_OTHER): Admitting: Cardiovascular Disease

## 2024-01-04 ENCOUNTER — Encounter: Payer: Self-pay | Admitting: Cardiovascular Disease

## 2024-01-04 VITALS — BP 109/78 | HR 66 | Ht 72.0 in | Wt 168.6 lb

## 2024-01-04 DIAGNOSIS — N1832 Chronic kidney disease, stage 3b: Secondary | ICD-10-CM | POA: Diagnosis not present

## 2024-01-04 DIAGNOSIS — I5033 Acute on chronic diastolic (congestive) heart failure: Secondary | ICD-10-CM

## 2024-01-04 DIAGNOSIS — F17210 Nicotine dependence, cigarettes, uncomplicated: Secondary | ICD-10-CM

## 2024-01-04 DIAGNOSIS — E782 Mixed hyperlipidemia: Secondary | ICD-10-CM | POA: Diagnosis not present

## 2024-01-04 DIAGNOSIS — I10 Essential (primary) hypertension: Secondary | ICD-10-CM

## 2024-01-04 NOTE — Progress Notes (Signed)
 Cardiology Office Note   Date:  01/04/2024   ID:  Henry Gallagher, DOB 06/24/1960, MRN [id number]  PCP:  Fernand Fredy RAMAN, MD  Cardiologist:  Denyse Fernand, MD      History of Present Illness: Henry Gallagher is a 63 y.o. male who presents for  Chief Complaint  Patient presents with   Follow-up    3 month follow up     Doing well, walking.      Past Medical History:  Diagnosis Date   Arthritis    Benign prostatic hypertrophy    CHF (congestive heart failure) (HCC)    Chronic kidney disease    Depression    Hyperlipidemia    Hypertension      Past Surgical History:  Procedure Laterality Date   COLONOSCOPY WITH PROPOFOL  N/A 05/18/2015   Procedure: COLONOSCOPY WITH PROPOFOL ;  Surgeon: Deward CINDERELLA Piedmont, MD;  [facility]: [facility] ENDOSCOPY;  Service: Gastroenterology;  Laterality: N/A;   TONSILLECTOMY AND ADENOIDECTOMY       Current Outpatient Medications  Medication Sig Dispense Refill   amLODipine (NORVASC) 10 MG tablet Take 10 mg by mouth daily.     aspirin EC 81 MG tablet Take 81 mg by mouth daily.     atorvastatin (LIPITOR) 80 MG tablet Take 80 mg by mouth daily.     carvedilol (COREG) 12.5 MG tablet Take 12.5 mg by mouth 2 (two) times daily with a meal.     chlorthalidone (HYGROTON) 25 MG tablet Take 25 mg by mouth daily.     Cholecalciferol (VITAMIN D-1000 MAX ST) 25 MCG (1000 UT) tablet Take 1,000 Units by mouth daily.     fenofibrate  (TRICOR ) 145 MG tablet Take 1 tablet (145 mg total) by mouth daily. 90 tablet 3   gabapentin  (NEURONTIN ) 100 MG capsule Take 1 capsule (100 mg total) by mouth 2 (two) times daily. 60 capsule 2   hydrALAZINE (APRESOLINE) 50 MG tablet Take 50 mg by mouth 3 (three) times daily.     lisinopril (ZESTRIL) 40 MG tablet Take 40 mg by mouth daily.     nitroGLYCERIN  (NITROSTAT ) 0.4 MG SL tablet Place 1 tablet (0.4 mg total) under the tongue every 5 (five) minutes as needed for chest pain. 100 tablet 3   No current facility-administered medications for  this visit.    Allergies:   Patient has no known allergies.    Social History:   reports that he has been smoking. He does not have any smokeless tobacco history on file. No history on file for alcohol use and drug use.   Family History:  family history is not on file.    ROS:     Review of Systems  Constitutional: Negative.   HENT: Negative.    Eyes: Negative.   Respiratory: Negative.    Gastrointestinal: Negative.   Genitourinary: Negative.   Musculoskeletal: Negative.   Skin: Negative.   Neurological: Negative.   Endo/Heme/Allergies: Negative.   Psychiatric/Behavioral: Negative.    All other systems reviewed and are negative.     All other systems are reviewed and negative.    PHYSICAL EXAM: VS:  BP 109/78   Pulse 66   Ht 6' (1.829 m)   Wt 168 lb 9.6 oz (76.5 kg)   SpO2 98%   BMI 22.87 kg/m  , BMI Body mass index is 22.87 kg/m. Last weight:  Wt Readings from Last 3 Encounters:  01/04/24 168 lb 9.6 oz (76.5 kg)  12/26/23 172 lb (78 kg)  10/24/23 170 lb 6.4 oz (77.3 kg)     Physical Exam Vitals reviewed.  Constitutional:      Appearance: Normal appearance. He is normal weight.  HENT:     Head: Normocephalic.     Nose: Nose normal.     Mouth/Throat:     Mouth: Mucous membranes are moist.  Eyes:     Pupils: Pupils are equal, round, and reactive to light.  Cardiovascular:     Rate and Rhythm: Normal rate and regular rhythm.     Pulses: Normal pulses.     Heart sounds: Normal heart sounds.  Pulmonary:     Effort: Pulmonary effort is normal.  Abdominal:     General: Abdomen is flat. Bowel sounds are normal.  Musculoskeletal:        General: Normal range of motion.     Cervical back: Normal range of motion.  Skin:    General: Skin is warm.  Neurological:     General: No focal deficit present.     Mental Status: He is alert.  Psychiatric:        Mood and Affect: Mood normal.       EKG:   Recent Labs: 12/26/2023: ALT 16; BUN 17; Creatinine,  Ser 1.84; Hemoglobin 14.8; Platelets 224; Potassium 4.3; Sodium 138    Lipid Panel    Component Value Date/Time   CHOL 128 12/26/2023 1016   TRIG 319 (H) 12/26/2023 1016   HDL 44 12/26/2023 1016   CHOLHDL 2.7 09/27/2023 1019   LDLCALC 37 12/26/2023 1016      Other studies Reviewed: Additional studies/ records that were reviewed today include:  Review of the above records demonstrates:       No data to display            ASSESSMENT AND PLAN:    ICD-10-CM   1. CHF (congestive heart failure), NYHA class III, acute on chronic, diastolic (HCC)  I50.33     2. Essential hypertension, benign  I10     3. Stage 3b chronic kidney disease (HCC)  N18.32    Has creat 1.84, thus do not recomend cath even thogh has inferior ischaemia on stress test. Also asymptomatic.    4. Cigarette nicotine dependence without complication  F17.210     5. Mixed hyperlipidemia  E78.2        Problem List Items Addressed This Visit       Cardiovascular and Mediastinum   Essential hypertension, benign   CHF (congestive heart failure), NYHA class III, acute on chronic, diastolic (HCC) - Primary     Genitourinary   Stage 3b chronic kidney disease (HCC)     Other   Mixed hyperlipidemia   Cigarette nicotine dependence without complication       Disposition:   Return in about 3 months (around 04/04/2024).    Total time spent: 35 minutes  Signed,  Denyse Bathe, MD  01/04/2024 9:33 AM    [facility]

## 2024-03-26 ENCOUNTER — Ambulatory Visit: Admitting: Internal Medicine

## 2024-03-26 ENCOUNTER — Encounter: Payer: Self-pay | Admitting: Internal Medicine

## 2024-03-26 VITALS — BP 122/78 | Ht 72.0 in | Wt 178.0 lb

## 2024-03-26 DIAGNOSIS — Z125 Encounter for screening for malignant neoplasm of prostate: Secondary | ICD-10-CM

## 2024-03-26 DIAGNOSIS — I10 Essential (primary) hypertension: Secondary | ICD-10-CM

## 2024-03-26 DIAGNOSIS — E782 Mixed hyperlipidemia: Secondary | ICD-10-CM

## 2024-03-26 DIAGNOSIS — N1832 Chronic kidney disease, stage 3b: Secondary | ICD-10-CM

## 2024-03-26 DIAGNOSIS — F17210 Nicotine dependence, cigarettes, uncomplicated: Secondary | ICD-10-CM

## 2024-03-26 NOTE — Progress Notes (Signed)
 Established Patient Office Visit  Subjective:  Patient ID: Henry Gallagher, male    DOB: 1960-11-09  Age: 63 y.o. MRN: [id number]  Chief Complaint  Patient presents with   Follow-up    3 month follow up    Patient is here today for follow up. He reports feeling well today and has no concerns to discuss. Patient states he has an appointment scheduled early first of the year [facility] as [facility]. He denies any chest pain, shortness of breath, palpitations, abdominal distress at this time. He reports taking his medications as prescribed.  He is due for routine blood work today and reports only having coffee for breakfast. Will collect labs and discuss results with patient. He is now 63 years old and due for initial medicare AWV. He is also past due for low dose chest CT for lung cancer screening as he is still actively smoking. Will order.     No other concerns at this time.   Past Medical History:  Diagnosis Date   Arthritis    Benign prostatic hypertrophy    CHF (congestive heart failure) (HCC)    Chronic kidney disease    Depression    Hyperlipidemia    Hypertension     Past Surgical History:  Procedure Laterality Date   COLONOSCOPY WITH PROPOFOL  N/A 05/18/2015   Procedure: COLONOSCOPY WITH PROPOFOL ;  Surgeon: Deward CINDERELLA Piedmont, MD;  Location: [facility] ENDOSCOPY;  Service: Gastroenterology;  Laterality: N/A;   TONSILLECTOMY AND ADENOIDECTOMY      Social History   Socioeconomic History   Marital status: Divorced    Spouse name: Not on file   Number of children: Not on file   Years of education: Not on file   Highest education level: Not on file  Occupational History   Not on file  Tobacco Use   Smoking status: Every Day   Smokeless tobacco: Not on file  Substance and Sexual Activity   Alcohol use: Not on file   Drug use: Not on file   Sexual activity: Not on file  Other Topics Concern   Not on file  Social History  Narrative   Not on file   Social Drivers of Health   Tobacco Use: High Risk (03/26/2024)   Patient History    Smoking Tobacco Use: Every Day    Smokeless Tobacco Use: Unknown    Passive Exposure: Not on file  Financial Resource Strain: Not on file  Food Insecurity: Not on file  Transportation Needs: Not on file  Physical Activity: Not on file  Stress: Not on file  Social Connections: Not on file  Intimate Partner Violence: Not on file  Depression (EYV7-0): Low Risk (05/15/2023)   Depression (PHQ2-9)    PHQ-2 Score: 0  Alcohol Screen: Not on file  Housing: Not on file  Utilities: Not on file  Health Literacy: Not on file    History reviewed. No pertinent family history.  Allergies[1]  Show/hide medication list[2]  Review of Systems  Constitutional: Negative.  Negative for chills, fever and malaise/fatigue.  HENT: Negative.  Negative for congestion and sore throat.   Eyes: Negative.  Negative for blurred vision and pain.  Respiratory: Negative.  Negative for cough and shortness of breath.   Cardiovascular: Negative.  Negative for chest pain, palpitations and leg swelling.  Gastrointestinal: Negative.  Negative for abdominal pain, blood in stool, constipation, diarrhea, heartburn, melena, nausea and vomiting.  Genitourinary: Negative.  Negative for  dysuria, flank pain, frequency and urgency.  Musculoskeletal: Negative.  Negative for joint pain and myalgias.  Skin: Negative.   Neurological: Negative.  Negative for dizziness, tingling, sensory change, weakness and headaches.  Endo/Heme/Allergies: Negative.   Psychiatric/Behavioral: Negative.  Negative for depression and suicidal ideas. The patient is not nervous/anxious.        Objective:   BP 122/78   Ht 6' (1.829 m)   Wt 178 lb (80.7 kg)   BMI 24.14 kg/m   Vitals:   03/26/24 0911  BP: 122/78  Height: 6' (1.829 m)  Weight: 178 lb (80.7 kg)  BMI (Calculated): 24.14    Physical Exam Vitals and nursing note  reviewed.  Constitutional:      General: He is not in acute distress.    Appearance: Normal appearance. He is not ill-appearing.  HENT:     Head: Normocephalic and atraumatic.     Nose: Nose normal.     Mouth/Throat:     Mouth: Mucous membranes are moist.     Pharynx: Oropharynx is clear.  Eyes:     Conjunctiva/sclera: Conjunctivae normal.     Pupils: Pupils are equal, round, and reactive to light.  Cardiovascular:     Rate and Rhythm: Normal rate and regular rhythm.     Pulses: Normal pulses.     Heart sounds: Normal heart sounds.  Pulmonary:     Effort: Pulmonary effort is normal.     Breath sounds: Normal breath sounds. No wheezing or rhonchi.  Abdominal:     General: Bowel sounds are normal. There is no distension.     Palpations: Abdomen is soft.     Tenderness: There is no abdominal tenderness.  Musculoskeletal:        General: Normal range of motion.     Cervical back: Normal range of motion and neck supple.     Right lower leg: No edema.     Left lower leg: No edema.  Skin:    General: Skin is warm and dry.     Capillary Refill: Capillary refill takes less than 2 seconds.  Neurological:     General: No focal deficit present.     Mental Status: He is alert and oriented to person, place, and time.     Sensory: No sensory deficit.     Motor: No weakness.  Psychiatric:        Mood and Affect: Mood normal.        Behavior: Behavior normal.        Judgment: Judgment normal.      No results found for any visits on 03/26/24.  No results found for this or any previous visit (from the past 2160 hours).    Assessment & Plan:  Continue medications as prescribed. Reinforced healthy diet and portion control. Exercise as tolerated. Recommend smoking cessation. Order low dose chest CT screening. Check routine blood work and FU with patient on results. Patient due for initial Medicare AWV Problem List Items Addressed This Visit       Cardiovascular and Mediastinum    Essential hypertension, benign   Relevant Orders   Hemoglobin A1c     Genitourinary   Stage 3b chronic kidney disease (HCC)   Relevant Orders   CMP14+EGFR   CBC with Diff   Hemoglobin A1c     Other   Mixed hyperlipidemia   Relevant Orders   Lipid Panel w/o Chol/HDL Ratio   Cigarette nicotine dependence without complication - Primary   Relevant Orders  CT CHEST LUNG CA SCREEN LOW DOSE W/O CM   Prostate cancer screening    Return in about 3 months (around 06/24/2024).   Total time spent: 25 minutes. This time includes review of previous notes and results and patient face to face interaction during today's visit.    FERNAND FREDY RAMAN, MD  03/26/2024   This document may have been prepared by [facility] Voice Recognition software and as such may include unintentional dictation errors.      [1] No Known Allergies [2]  Outpatient Medications Prior to Visit  Medication Sig   amLODipine (NORVASC) 10 MG tablet Take 10 mg by mouth daily.   aspirin EC 81 MG tablet Take 81 mg by mouth daily.   atorvastatin (LIPITOR) 80 MG tablet Take 80 mg by mouth daily.   carvedilol (COREG) 12.5 MG tablet Take 12.5 mg by mouth 2 (two) times daily with a meal.   chlorthalidone (HYGROTON) 25 MG tablet Take 25 mg by mouth daily.   Cholecalciferol (VITAMIN D-1000 MAX ST) 25 MCG (1000 UT) tablet Take 1,000 Units by mouth daily.   fenofibrate  (TRICOR ) 145 MG tablet Take 1 tablet (145 mg total) by mouth daily.   gabapentin  (NEURONTIN ) 100 MG capsule Take 1 capsule (100 mg total) by mouth 2 (two) times daily.   hydrALAZINE (APRESOLINE) 50 MG tablet Take 50 mg by mouth 3 (three) times daily.   lisinopril (ZESTRIL) 40 MG tablet Take 40 mg by mouth daily.   nitroGLYCERIN  (NITROSTAT ) 0.4 MG SL tablet Place 1 tablet (0.4 mg total) under the tongue every 5 (five) minutes as needed for chest pain.   No facility-administered medications prior to visit.

## 2024-03-27 ENCOUNTER — Ambulatory Visit: Payer: Self-pay | Admitting: Internal Medicine

## 2024-03-27 LAB — CMP14+EGFR
ALT: 11 IU/L (ref 0–44)
AST: 18 IU/L (ref 0–40)
Albumin: 4.1 g/dL (ref 3.9–4.9)
Alkaline Phosphatase: 44 IU/L — ABNORMAL LOW (ref 47–123)
BUN/Creatinine Ratio: 10 (ref 10–24)
BUN: 21 mg/dL (ref 8–27)
Bilirubin Total: 0.5 mg/dL (ref 0.0–1.2)
CO2: 23 mmol/L (ref 20–29)
Calcium: 10 mg/dL (ref 8.6–10.2)
Chloride: 101 mmol/L (ref 96–106)
Creatinine, Ser: 2.01 mg/dL — ABNORMAL HIGH (ref 0.76–1.27)
Globulin, Total: 2.4 g/dL (ref 1.5–4.5)
Glucose: 83 mg/dL (ref 70–99)
Potassium: 4.5 mmol/L (ref 3.5–5.2)
Sodium: 136 mmol/L (ref 134–144)
Total Protein: 6.5 g/dL (ref 6.0–8.5)
eGFR: 36 mL/min/1.73 — ABNORMAL LOW (ref >59)

## 2024-03-27 LAB — LIPID PANEL W/O CHOL/HDL RATIO
Cholesterol, Total: 133 mg/dL (ref 100–199)
HDL: 51 mg/dL (ref >39)
LDL Chol Calc (NIH): 60 mg/dL (ref 0–99)
Triglycerides: 121 mg/dL (ref 0–149)
VLDL Cholesterol Cal: 22 mg/dL (ref 5–40)

## 2024-03-27 LAB — CBC WITH DIFFERENTIAL/PLATELET
Basophils Absolute: 0 x10E3/uL (ref 0.0–0.2)
Basos: 1 %
EOS (ABSOLUTE): 0.5 x10E3/uL — ABNORMAL HIGH (ref 0.0–0.4)
Eos: 8 %
Hematocrit: 45 % (ref 37.5–51.0)
Hemoglobin: 14.9 g/dL (ref 13.0–17.7)
Immature Grans (Abs): 0 x10E3/uL (ref 0.0–0.1)
Immature Granulocytes: 0 %
Lymphocytes Absolute: 2.2 x10E3/uL (ref 0.7–3.1)
Lymphs: 37 %
MCH: 34.3 pg — ABNORMAL HIGH (ref 26.6–33.0)
MCHC: 33.1 g/dL (ref 31.5–35.7)
MCV: 103 fL — ABNORMAL HIGH (ref 79–97)
Monocytes Absolute: 0.7 x10E3/uL (ref 0.1–0.9)
Monocytes: 12 %
Neutrophils Absolute: 2.6 x10E3/uL (ref 1.4–7.0)
Neutrophils: 42 %
Platelets: 240 x10E3/uL (ref 150–450)
RBC: 4.35 x10E6/uL (ref 4.14–5.80)
RDW: 13.6 % (ref 11.6–15.4)
WBC: 6.1 x10E3/uL (ref 3.4–10.8)

## 2024-03-27 LAB — HEMOGLOBIN A1C
Est. average glucose Bld gHb Est-mCnc: 108 mg/dL
Hgb A1c MFr Bld: 5.4 % (ref 4.8–5.6)

## 2024-03-27 NOTE — Progress Notes (Signed)
 Patient notified

## 2024-04-09 ENCOUNTER — Encounter: Payer: Self-pay | Admitting: Cardiovascular Disease

## 2024-04-09 ENCOUNTER — Ambulatory Visit (INDEPENDENT_AMBULATORY_CARE_PROVIDER_SITE_OTHER): Admitting: Cardiovascular Disease

## 2024-04-09 VITALS — BP 117/81 | HR 59 | Ht 72.0 in | Wt 177.8 lb

## 2024-04-09 DIAGNOSIS — I10 Essential (primary) hypertension: Secondary | ICD-10-CM

## 2024-04-09 DIAGNOSIS — E782 Mixed hyperlipidemia: Secondary | ICD-10-CM

## 2024-04-09 DIAGNOSIS — I351 Nonrheumatic aortic (valve) insufficiency: Secondary | ICD-10-CM | POA: Diagnosis not present

## 2024-04-09 DIAGNOSIS — N1832 Chronic kidney disease, stage 3b: Secondary | ICD-10-CM | POA: Diagnosis not present

## 2024-04-09 DIAGNOSIS — I340 Nonrheumatic mitral (valve) insufficiency: Secondary | ICD-10-CM | POA: Diagnosis not present

## 2024-04-09 DIAGNOSIS — I5033 Acute on chronic diastolic (congestive) heart failure: Secondary | ICD-10-CM

## 2024-04-09 NOTE — Progress Notes (Signed)
 "     Cardiology Office Note   Date:  04/09/2024   ID:  Henry, Gallagher 05-14-1960, MRN [id number]  PCP:  Fernand Fredy RAMAN, MD  Cardiologist:  Denyse Fernand, MD      History of Present Illness: Henry Gallagher is a 63 y.o. male who presents for  Chief Complaint  Patient presents with   Follow-up    3 month follow up     Feels good.      Past Medical History:  Diagnosis Date   Arthritis    Benign prostatic hypertrophy    CHF (congestive heart failure) (HCC)    Chronic kidney disease    Depression    Hyperlipidemia    Hypertension      Past Surgical History:  Procedure Laterality Date   COLONOSCOPY WITH PROPOFOL  N/A 05/18/2015   Procedure: COLONOSCOPY WITH PROPOFOL ;  Surgeon: Deward CINDERELLA Piedmont, MD;  Location: [facility] ENDOSCOPY;  Service: Gastroenterology;  Laterality: N/A;   TONSILLECTOMY AND ADENOIDECTOMY       Current Outpatient Medications  Medication Sig Dispense Refill   amLODipine (NORVASC) 10 MG tablet Take 10 mg by mouth daily.     aspirin EC 81 MG tablet Take 81 mg by mouth daily.     atorvastatin (LIPITOR) 80 MG tablet Take 80 mg by mouth daily.     carvedilol (COREG) 12.5 MG tablet Take 12.5 mg by mouth 2 (two) times daily with a meal.     chlorthalidone (HYGROTON) 25 MG tablet Take 25 mg by mouth daily.     Cholecalciferol (VITAMIN D-1000 MAX ST) 25 MCG (1000 UT) tablet Take 1,000 Units by mouth daily.     fenofibrate  (TRICOR ) 145 MG tablet Take 1 tablet (145 mg total) by mouth daily. 90 tablet 3   gabapentin  (NEURONTIN ) 100 MG capsule Take 1 capsule (100 mg total) by mouth 2 (two) times daily. 60 capsule 2   hydrALAZINE (APRESOLINE) 50 MG tablet Take 50 mg by mouth 3 (three) times daily.     lisinopril (ZESTRIL) 40 MG tablet Take 40 mg by mouth daily.     nitroGLYCERIN  (NITROSTAT ) 0.4 MG SL tablet Place 1 tablet (0.4 mg total) under the tongue every 5 (five) minutes as needed for chest pain. 100 tablet 3   No current facility-administered medications for this  visit.    Allergies:   Patient has no known allergies.    Social History:   reports that he has been smoking. He does not have any smokeless tobacco history on file. No history on file for alcohol use and drug use.   Family History:  family history is not on file.    ROS:     Review of Systems  Constitutional: Negative.   HENT: Negative.    Eyes: Negative.   Respiratory: Negative.    Gastrointestinal: Negative.   Genitourinary: Negative.   Musculoskeletal: Negative.   Skin: Negative.   Neurological: Negative.   Endo/Heme/Allergies: Negative.   Psychiatric/Behavioral: Negative.    All other systems reviewed and are negative.     All other systems are reviewed and negative.    PHYSICAL EXAM: VS:  BP 117/81   Pulse (!) 59   Ht 6' (1.829 m)   Wt 177 lb 12.8 oz (80.6 kg)   SpO2 96%   BMI 24.11 kg/m  , BMI Body mass index is 24.11 kg/m. Last weight:  Wt Readings from Last 3 Encounters:  04/09/24 177 lb 12.8 oz (80.6 kg)  03/26/24 178 lb (  80.7 kg)  01/04/24 168 lb 9.6 oz (76.5 kg)     Physical Exam Vitals reviewed.  Constitutional:      Appearance: Normal appearance. He is normal weight.  HENT:     Head: Normocephalic.     Nose: Nose normal.     Mouth/Throat:     Mouth: Mucous membranes are moist.  Eyes:     Pupils: Pupils are equal, round, and reactive to light.  Cardiovascular:     Rate and Rhythm: Normal rate and regular rhythm.     Pulses: Normal pulses.     Heart sounds: Normal heart sounds.  Pulmonary:     Effort: Pulmonary effort is normal.  Abdominal:     General: Abdomen is flat. Bowel sounds are normal.  Musculoskeletal:        General: Normal range of motion.     Cervical back: Normal range of motion.  Skin:    General: Skin is warm.  Neurological:     General: No focal deficit present.     Mental Status: He is alert.  Psychiatric:        Mood and Affect: Mood normal.       EKG:   Recent Labs: 03/26/2024: ALT 11; BUN 21;  Creatinine, Ser 2.01; Hemoglobin 14.9; Platelets 240; Potassium 4.5; Sodium 136    Lipid Panel    Component Value Date/Time   CHOL 133 03/26/2024 0929   TRIG 121 03/26/2024 0929   HDL 51 03/26/2024 0929   CHOLHDL 2.7 09/27/2023 1019   LDLCALC 60 03/26/2024 0929      Other studies Reviewed: Additional studies/ records that were reviewed today include:  Review of the above records demonstrates:       No data to display            ASSESSMENT AND PLAN:    ICD-10-CM   1. CHF (congestive heart failure), NYHA class III, acute on chronic, diastolic (HCC)  I50.33    Compensated.    2. Nonrheumatic aortic valve insufficiency  I35.1     3. Nonrheumatic mitral valve regurgitation  I34.0     4. Essential hypertension, benign  I10     5. Mixed hyperlipidemia  E78.2     6. Stage 3b chronic kidney disease (HCC)  N18.32        Problem List Items Addressed This Visit       Cardiovascular and Mediastinum   Essential hypertension, benign   CHF (congestive heart failure), NYHA class III, acute on chronic, diastolic (HCC) - Primary     Genitourinary   Stage 3b chronic kidney disease (HCC)     Other   Mixed hyperlipidemia   Other Visit Diagnoses       Nonrheumatic aortic valve insufficiency         Nonrheumatic mitral valve regurgitation              Disposition:   Return in about 3 months (around 07/08/2024).    Total time spent: 35 minutes  Signed,  Denyse Bathe, MD  04/09/2024 9:31 AM    [facility] "

## 2024-06-24 ENCOUNTER — Ambulatory Visit: Admitting: Internal Medicine

## 2024-07-25 ENCOUNTER — Ambulatory Visit: Admitting: Cardiovascular Disease
